# Patient Record
Sex: Female | Born: 1940 | Race: White | Hispanic: No | Marital: Married | Smoking: Never smoker
Health system: Southern US, Community
[De-identification: ages and names within clinical notes are randomized; demographics above are authoritative.]

## PROBLEM LIST (undated history)

## (undated) DIAGNOSIS — E119 Type 2 diabetes mellitus without complications: Secondary | ICD-10-CM

## (undated) DIAGNOSIS — I519 Heart disease, unspecified: Secondary | ICD-10-CM

## (undated) DIAGNOSIS — M1990 Unspecified osteoarthritis, unspecified site: Secondary | ICD-10-CM

## (undated) DIAGNOSIS — M810 Age-related osteoporosis without current pathological fracture: Secondary | ICD-10-CM

## (undated) HISTORY — DX: Age-related osteoporosis without current pathological fracture: M81.0

## (undated) HISTORY — PX: SPINAL FUSION: SHX223

## (undated) HISTORY — DX: Type 2 diabetes mellitus without complications: E11.9

## (undated) HISTORY — DX: Heart disease, unspecified: I51.9

## (undated) HISTORY — DX: Unspecified osteoarthritis, unspecified site: M19.90

---

## 2006-06-27 DIAGNOSIS — F32A Depression, unspecified: Secondary | ICD-10-CM | POA: Insufficient documentation

## 2006-09-27 ENCOUNTER — Ambulatory Visit: Payer: Self-pay | Admitting: Cardiology

## 2006-10-06 DIAGNOSIS — M797 Fibromyalgia: Secondary | ICD-10-CM | POA: Insufficient documentation

## 2007-01-17 ENCOUNTER — Inpatient Hospital Stay (HOSPITAL_COMMUNITY): Admission: RE | Admit: 2007-01-17 | Discharge: 2007-01-22 | Payer: Self-pay | Admitting: Neurosurgery

## 2007-07-09 ENCOUNTER — Inpatient Hospital Stay (HOSPITAL_COMMUNITY): Admission: RE | Admit: 2007-07-09 | Discharge: 2007-07-12 | Payer: Self-pay | Admitting: Orthopedic Surgery

## 2010-03-31 ENCOUNTER — Other Ambulatory Visit: Payer: Self-pay | Admitting: General Surgery

## 2010-03-31 DIAGNOSIS — N6320 Unspecified lump in the left breast, unspecified quadrant: Secondary | ICD-10-CM

## 2010-04-08 ENCOUNTER — Ambulatory Visit
Admission: RE | Admit: 2010-04-08 | Discharge: 2010-04-08 | Disposition: A | Discharge status: Home / Self Care | Payer: PRIVATE HEALTH INSURANCE | Source: Ambulatory Visit | Attending: General Surgery | Admitting: General Surgery

## 2010-04-08 ENCOUNTER — Other Ambulatory Visit: Payer: Self-pay | Admitting: General Surgery

## 2010-04-08 DIAGNOSIS — N6320 Unspecified lump in the left breast, unspecified quadrant: Secondary | ICD-10-CM

## 2010-04-08 DIAGNOSIS — N63 Unspecified lump in unspecified breast: Secondary | ICD-10-CM

## 2010-06-21 DIAGNOSIS — D099 Carcinoma in situ, unspecified: Secondary | ICD-10-CM

## 2010-06-21 DIAGNOSIS — D229 Melanocytic nevi, unspecified: Secondary | ICD-10-CM

## 2010-06-21 HISTORY — DX: Melanocytic nevi, unspecified: D22.9

## 2010-06-21 HISTORY — DX: Carcinoma in situ, unspecified: D09.9

## 2010-07-20 NOTE — H&P (Signed)
NAME:  Zoe Fleming, Zoe Fleming                 ACCOUNT NO.:  [id number]   MEDICAL RECORD NO.:  [id number]          PATIENT TYPE:  INP   LOCATION:  NA                           FACILITY:  [facility]   PHYSICIAN:  Ollen Gross, M.D.    DATE OF BIRTH:  Apr 11, 1940   DATE OF ADMISSION:  07/09/2007  DATE OF DISCHARGE:                              HISTORY & PHYSICAL   CHIEF COMPLAINT:  Painful left knee.   PRESENT ILLNESS:  The patient is a 70 year old female with history of  left knee pain with difficulty with range of motion and pain with  ambulation.  Patient has failed conservative treatment.  The patient has  elected proceed with a left total knee arthroplasty by Ollen Gross,  M.D.  The patient recently has gone through a back fusion with Hewitt Shorts, M.D..  She also has some arthritic changes in her right knee.   PAST MEDICAL HISTORY:  Includes:  1. Hypertension.  2. Recent spinal fusion at L3-4, 4-5/.   CURRENT MEDICATIONS:  Include:  1. Percocet 1 or 2 tablets every 4-6 hours p.r.n.  2. Lisinopril/hydrochlorothiazide 10/12.5 mg once a day.  3. Fortical nasal spray once a day.  4. Fish oil.  5. Calcium 1200 mg a day.  6. Coenzyme Q.  7. Vitamin D3.   ALLERGIES:  NO KNOWN DRUG ALLERGIES, BUT THE PATIENT HAS NAUSEA WITH A  SULFA.  THE PATIENT HAD SOME BLOOD PRESSURE ISSUES WITH MORPHINE WITH  PREVIOUS SURGICAL PROCEDURES.   PAST SURGICAL HISTORY:  Includes gallbladder removal in 1974, tubes tied  in 1979, a bladder tack in 1974, lumbar spinal fusion L3-4 and L4-5 in  01/2007 with just nausea with anesthesia last time requiring an NG tube.   FAMILY MEDICAL HISTORY:  Includes father is deceased from stroke at age  of 51.  Mother is deceased at 32 from pneumonia, one brother who has had  some heart attacks, one sister with cancer.   SOCIAL HISTORY:  The patient is married, lives with her husband in a one-  story house.  No history of smoking or alcohol use.   REVIEW OF SYSTEMS:   Negative for any neurologic issues.  PULMONARY:  She has had problems with pneumonia.  She was hospitalized  in the 1970s and bronchitis in 2007 without any recent issues.  CARDIOVASCULAR:  She does have some hypertension and some elevated  cholesterol.  She did have some inflammation around her heart in 2000,  but recent stress test and EKGs have been normal.  She has had no  recurrence or sequelae.  GI: She does have some issues with reflux.  Otherwise no other issues.  GU: She denies anything other a last kidney stone in 2002.  ENDOCRINE:  Negative.  HEMATOLOGIC:  Negative.   PHYSICAL EXAM:  VITALS:  Height is 5 feet, 4, weight is 180 pounds,  blood pressure is 142/84, pulse of 76, respirations 16, patient is  afebrile.  HEENT: Head was normocephalic.  Pupils equal, round and reactive.  Gross  hearing is intact.  NECK:  Supple.  Good range  of motion.  CHEST:  Lung sounds clear and equal bilaterally.  No wheezes, rales,  rhonchi.  HEART:  Regular rate and rhythm.  No murmurs, rubs or gallops.  ABDOMEN:  Soft, nontender.  Bowel sounds present.  EXTREMITIES:  Upper extremities had excellent range of motion without  any difficulty.  Lower Extremities:  Both hips had excellent range of motion.  The left  knee had 5 degrees short of full extension, flexion 125 degrees with no  instability.  No signs of infection.  Right knee had full extension,  flexion to 120 degrees.  Both calves were soft, nontender.  She had good  range of motion of both ankles.  PERIPHERAL VASCULAR:  Carotid pulses were 2+, no bruits.  Radial pulses  2+, dorsalis pedis pulses were 1+.  She had no lower extremity edema.  BREAST, RECTAL AND GU EXAMS:  Deferred at this time.   PRIMARY CARE PHYSICIAN:  Dr. Helane Gunther with [facility] in  [place], [place].   IMPRESSION:  1. End-stage osteoarthritis, left knee.  2. Hypertension.  3. Reflux disease.  4. Recent lumbar fusion L3-4, L4-5 by Hewitt Shorts,  M.D.   PLAN:  The patient will undergo all routine labs and tests prior to  having a left total knee arthroplasty by Ollen Gross, M.D. with [facility] on 07/09/2007.      Jamelle Rushing, P.A.      Ollen Gross, M.D.  Electronically Signed    RWK/MEDQ  D:  06/28/2007  T:  06/28/2007  Job:  [id number]   cc:   Ollen Gross, M.D.  Fax: [phone]

## 2010-07-20 NOTE — Discharge Summary (Signed)
NAME:  Zoe Fleming, Zoe Fleming                 ACCOUNT NO.:  [id number]   MEDICAL RECORD NO.:  [id number]          PATIENT TYPE:  INP   LOCATION:  3013                         FACILITY:  [facility]   PHYSICIAN:  Coletta Memos, M.D.     DATE OF BIRTH:  06-Nov-1940   DATE OF ADMISSION:  01/17/2007  DATE OF DISCHARGE:  01/22/2007                               DISCHARGE SUMMARY   ADMITTING DIAGNOSES:  Lumbar stenosis, lumbar spondylolisthesis,  acquired lumbar spondylosis, lumbar degenerative disk disease.   DISCHARGE DIAGNOSES:  Lumbar stenosis, lumbar spondylolisthesis,  acquired lumbar spondylosis, lumbar degenerative disk disease.   PROCEDURE:  L4-5, L5-S1 posterior lumbar interbody arthrodesis, AVS peak  interbody implants, L4-S1 posterolateral arthrodesis, and radius  posterior segmental instrumentation, L4-S1.   COMPLICATIONS:  None.   DISCHARGE STATUS:  Alive and well.   DISCHARGE DESTINATION:  Home.   At the time of discharge, the patient is voiding, ambulating, tolerating  a regular diet without difficulty.  Wound is clean, dry, no signs of  infection.  She was given Percocet and Vistaril at discharge for pain  control.   [name] was admitted, taken to the operating room by Dr. Shirlean Kelly for a lumbar stenosis L4-S1, and a spondylolisthesis.  She had  an uncomplicated procedure, and postoperatively he did quite well.  At  discharge, he is continuing to improve and well be sent home with home  health therapy consult.  She has a return appointment see Dr. Newell Coral,  for staple removal.           ______________________________  Coletta Memos, M.D.     KC/MEDQ  D:  01/22/2007  T:  01/22/2007  Job:  [id number]

## 2010-07-20 NOTE — Op Note (Signed)
NAME:  Zoe Fleming, SCHOOLS NO.:  [id number]   MEDICAL RECORD NO.:  [id number]          PATIENT TYPE:  INP   LOCATION:  3172                         FACILITY:  [facility]   PHYSICIAN:  Hewitt Shorts, M.D.DATE OF BIRTH:  [birth year]   DATE OF PROCEDURE:  01/17/2007  DATE OF DISCHARGE:                               OPERATIVE REPORT   PREOPERATIVE DIAGNOSES:  1. Lumbar stenosis.  2. Lumbar spondylolisthesis (dynamic).  3. Lumbar spondylosis.  4. Lumbar degenerative disc disease.   POSTOPERATIVE DIAGNOSES:  1. Lumbar stenosis.  2. Lumbar spondylolisthesis (dynamic).  3. Lumbar spondylosis.  4. Lumbar degenerative disc disease.   PROCEDURE:  1. L4, L5, and S1 lumbar laminectomy, facetectomy, foraminotomy with      microdissection for decompression, beyond that required for      performing a posterior lumbar interbody fusion and with      decompression of the exiting L4, L5, and S1 nerve roots.  2. L4-L5 and L5-S1 posterior lumbar interbody fusion with AVS peak      interbody implants and mosaic bone substitute with bone marrow      aspirate and L4 to S1 posterolateral arthrodesis with radius      posterior instrumentation, mosaic with bone marrow aspirate, and      Infuse.   ASSISTANTS:  Russell L. Webb Silversmith, RN  Stefani Dama, M.D.   ANESTHESIA:  General endotracheal.   INDICATIONS:  The patient is a 70 year old woman who presented with  neurogenic claudication.  MRI scan showed advanced degenerative changes  at the L4-L5 and L5-S1 levels with advanced facet arthropathy and a  grade I dynamic degenerative spondylolisthesis at L4 and L5 and a grade  I possibly dynamic spondylolisthesis at L5 and S1.  There was  significant canal and neuroforaminal stenosis, and a decision was made  to proceed with decompression and stabilization.   PROCEDURE IN DETAIL:  The patient was brought to the operating room and  was placed under general endotracheal anesthesia.  The  patient was  turned to prone position.  Lumbar region was prepped with Betadine scrub  and Betadine solution and draped in a sterile fashion.  The midline was  infiltrated with local anesthetic of epinephrine and a midline incision  was made over the lower lumbar region and carried down through the  subcutaneous tissue.  Bipolar electrocautery was used to maintain  hemostasis.  Dissection was carried down from the lumbar fascia, which  was incised bilaterally.  The paraspinal muscle was dissected from the  spinous process and lamina in a subperiosteal fashion.  X-ray was taken  at the L4, L5, and S1 spinous processes and lamina was identified and  then we proceeded with decompression and subsequently with the  stabilization.   A self-retaining retractor was placed and dissection was carried out  laterally exposing the facet joints, which were markedly hypertrophic as  well as the transverse process at L4 and L5 and the ala of S1  bilaterally.  Marked laxity of the L4-L5 facet joint was noted.  We  proceeded with an wide L4, L5, and S1  laminectomy and facetectomy and  foraminotomy using double-action rongeurs, the X-Max drill and Kerrison  punches.  There was marked stenosis at L4-L5, worse than L5-S1 and we  carefully with microdissection, decompressed the thecal sac, which was  severely compressed.  We then identified the exiting L4, L5, and S1  nerve roots bilaterally and continued the dissection laterally into the  neuroforamen and decompressed the nerve roots as they exited  bilaterally.  We then further explored the epidural space identifying  the annulus of the L4-L5 and L5-S1 discs bilaterally and overlying  epidural veins were coagulated and divided.  The listhesis at each level  was noted and we proceeded with the bilateral discectomy at each level  incising the annulus with a variety of microcurettes and pituitary  rongeurs.  Once a thorough discectomy was performed at each  level, we  began to prepare the endplates using the variety of paddle curettes to  remove the cartilaginous endplate surfaces down to a good bony surface.  We then measured the height of the disc spaces and selected PEEK  interbody implants at L4-L5, 30 mm in height, 25 mm in depth, 8 degrees  of lordosis, and 11 mm of width.  At L5-S1, we selected 10 mm of height,  25 mm of depth, 4 degrees of lordosis, and 11 mm of width.  We then  probed the right L5 pedicle and aspirated bone marrow aspirated, which  was injected over 215 mL strips of mosaic, each of which was saturated  with bone marrow aspirate.  Then, we packed the interbody implants with  the mosaic with bone marrow aspirate and then carefully retracting the  thecal sac and nerve roots, first on the right side placing the implants  on the right at L4-L5 and then packing additional mosaic in the midline  of the disc space and then placing the second implant on the left side  at L4-L5.  We then packed additional mosaic lateral to the implants  bilaterally at L4-L5.   At L5-S1, we similarly did interbody fusion placing the implant first on  the right side and then packing additional mosaic in the midline and  then placing the second implant on the left side with additional mosaic  being tacked lateral to the implants.  Once the interbody fusion was  completed, we proceeded with posterolateral arthrodesis.   The C-arm fluoroscope was draped and brought into the field to provide  guidance in placing posterior instrumentation and we identified pedicle  entry sites for L4, L5, and S1 bilaterally.  Each of the pedicles was  probed, tapped with a 5.25-mm tap, and we placed 5.75 x 45 mm screws at  L4, 5.75 x 40-mm screws at L5, and 5.75 x 35-mm screws at S1.  Each of  the screw holes was drilled and examined with a ball probe, no cut outs  were found and tapped with a 5.25-mm tap.  I again examined with a ball  probe, no cut outs were  found, good threading was noted and then we  placed each of the 6 screws.  We then selected two 50-mm pre-lordosed  rods.  They were positioned in the screw heads, and secured with locking  caps, which were subsequently tightened again with the counter torque.  We then selected a medium sized cross-link, which was placed between the  L4 and L5 screws, secured to the rods, and then tightened the middle.  We then went ahead and proceeded with the posterolateral listhesis  tapping bone graft laterally and the lateral gutters over the transverse  processes and ala, which were decorticated.  We placed Infuse medium-  sized inner lateral gutters and overlaid that with the mosaic with bone  marrow aspirates, which was compressed against the bony surfaces.  The  wounds had been irrigated numerous times at the procedure with both  saline solution and Bacitracin solution.  Good hemostasis was  established and then we proceeded with closure.  The paraspinal muscle  was reapproximated with interrupted undyed 1 Vicryl suture.  The deep  fascia was closed with interrupted undyed 1 Vicryl suture.  The Scarpa  fascia was closed with interrupted undyed 1 Vicryl suture, and the  subcutaneous and subcuticular were closed with interrupted inverted 2-0  Vicryl sutures and the skin was reapproximated with surgical staples.  The wound was dressed with Adaptic and sterile gauze.  The patient was  tolerated well.  Estimated blood loss was 500 mL.  We were able to give  the patient back 230 mL of blood.  Sponge and needle counts were  correct.  Following the surgery, the patient was turned back to supine  position to be reverse from the anesthetic, extubated, and transferred  to the recovery room for further care.      Hewitt Shorts, M.D.  Electronically Signed     RWN/MEDQ  D:  01/17/2007  T:  01/18/2007  Job:  [id number]

## 2010-07-20 NOTE — Op Note (Signed)
NAME:  Zoe Fleming, SELDON NO.:  [id number]   MEDICAL RECORD NO.:  [id number]          PATIENT TYPE:  INP   LOCATION:  0004                         [facility]   PHYSICIAN:  Ollen Gross, M.D.    DATE OF BIRTH:  1940/04/11   DATE OF PROCEDURE:  07/09/2007  DATE OF DISCHARGE:                               OPERATIVE REPORT   PREOPERATIVE DIAGNOSIS:  Osteoarthritis, left knee.   POSTOPERATIVE DIAGNOSIS:  Osteoarthritis, left knee.   OPERATIONS:  Left total knee arthroplasty.   SURGEON:  Ollen Gross, M.D.   ASSISTANT:  Alexzandrew L. Perkins, PA-C.   ANESTHESIA:  General with postop marcaine pain pump.   ESTIMATED BLOOD LOSS:  Minimal.   DRAINS:  None.   TOURNIQUET TIME:  28 minutes at 300 mmHg.   COMPLICATIONS:  None.   CONDITION:  Stable to recovery.   BRIEF CLINICAL NOTE:  Zoe Fleming is a 70 year old female with end-stage  arthritis of the left knee with progressively worsening pain and  dysfunction.  She has failed nonoperative management and presents now  for total knee arthroplasty.   DESCRIPTION OF PROCEDURE:  After successful administration of general  anesthetic, a tourniquet was placed high on her left thigh and left  lower extremity prepped and draped in the usual sterile fashion.  The  extremity was wrapped in Esmarch, knee flexed, tourniquet inflated to  300 mmHg.  Midline incision is made with a 10 blade through the  subcutaneous tissue to the level of the extensor mechanism.  Fresh blade  was used to made a medial parapatellar arthrotomy.  Soft tissue over the  proximal and medial tibia is subperiosteally elevated to the joint line  with the knife and into the semimembranosus bursa with a Cobb elevator.  Soft tissue laterally is elevated with attention being paid to avoid the  patellar tendon on the tibial tubercle.  The patella was subluxed  laterally, knee flexed 90 degrees, ACL and PCL removed.  A drill was  used to create a  starting hole in the distal femur and the canal is  thoroughly irrigated.  The 5 degree left valgus alignment guide is  placed and referencing off the posterior condyle as rotation is marked  and the block pinned to removed 11 mm off the distal femur.  I took 11  because of the preoperative flexion contracture.  Distal femoral  resection is made with an oscillating saw.  Sizing block is placed.  Size 2.5 is most appropriate.  The size 2.5 cutting block is placed and  then the anterior, posterior and chamfer cuts are made.   The tibia is subluxed forward and the menisci removed.  Extramedullary  tibial alignment guide is placed referencing proximally at the medial  aspect of the tibial tubercle and distally along the second metatarsal  axis and tibial crest.  The block is pinned to remove 10 mm off the non  deficient lateral side.  I had to go down 2 more millimeters to get to  the base of the defect.  Tibial resection is made with an oscillating  size.  The size 2.5 is the most appropriate tibial component and the  proximal tibia is prepared with the modular drill and keel punch to size  2.5.  Femoral preparation is completed with the anterior condylar cut.   The size 2.5 mobile bearing tibial trial, 2.5 posterior stabilized  femoral trial and a 12.5 mm posterior stabilized rotating platform  insert trial are placed.  With the 12.5, full extension is achieved with  excellent varus and valgus balance throughout full range of motion.  The  patella is then everted and thickness measured to be 20 mm.  Free hand  resection is taken to 11 mm, 35 template is placed, lug holes are  drilled, trial patella is placed and it tracks normally.  Osteophytes  are removed off the posterior femur with the trial in place.  All trials  are removed and the cut bone surfaces are prepared with pulsatile  lavage.  Cement is mixed and once ready for implantation the size 2.5  mobile bearing tibial tray, size 2.5  posterior stabilized femur and 35  patella was cemented into place and patella is held with a clamp.  Trial  12.5 insert is placed, knee held in full extension and all extruded  cement removed.  Once the cement is fully hardened, then the wound is  copiously irrigated with saline solution and the FloSeal injected in the  posterior capsule.  The permanent 12.5 mm posterior stabilized rotating  platform insert is then placed in the tibial tray.  FloSeal is then  injected in the medial and lateral gutters and suprapatellar area.  The  moist sponge is placed and tourniquet released with a total time of 28  minutes.  Sponge is held to 2 minutes then removed.  Minimal bleeding is  encountered.  The bleeding that is encountered is stopped with  electrocautery.  The wound is copiously irrigated with saline solution  and the extensor mechanism closed with the running #2 Quill suture.  The  subcu is closed with the 0 Quill suture and subcuticular running 4-0  Monocryl.  Flexion against gravity was 140 degrees.  The incision is  cleaned and dried and the catheter for the Marcaine pain pump is placed  and the pump is initiated.  Steri-Strips and a bulky sterile dressing  are applied.  She is awakened and transported to recovery in stable  condition.      Ollen Gross, M.D.  Electronically Signed     FA/MEDQ  D:  07/09/2007  T:  07/09/2007  Job:  [id number]   cc:   Alexzandrew L. Julien Girt, P.A.C.

## 2010-07-20 NOTE — H&P (Signed)
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  She has had a history of low back pain for at least 14  years, maybe longer, worse though over the past year.  She does not  recall any particular setting cause that aggravated the pain, complains  of pain in the low back extending to the buttocks, posterior thigh, and  legs bilaterally with numbness and tingling of her lower extremities.  It is relieved by laying down and begins immediately upon standing and  walking.  She is limiting walking to less than a block before the pain  becomes incapacitating.  She is being treated with epidural steroid  injections, Voltaren patches, Neurontin, Limbrel, and Ultracet.  She has  undergone chiropractic treatment including decompressive therapy without  any relief.  She underwent acupuncture without any relief.   The patient was evaluated with MRI scan.  It revealed degenerative  spondylolisthesis at L4-L5 and L5-S1.  X-rays with flexion-extension  views showed that these are dynamic listhesis.  There is a broad-based  disk protrusions at L4-L5, worse than L5-S1 with resulting  multifactorial multilevel lumbar stenosis at L4-L5 worse than L5-S1.   The patient is admitted now for decompression and arthrodesis.   PAST MEDICAL HISTORY:  Notable for history of hypertension for over 25  years as well as a history of gastroesophageal reflux disease, restless  leg syndrome,  and osteoarthritis.  No history though of myocardial  infarction, cancer, stroke, diabetes, or lung disease.   PREVIOUS SURGERIES:  Includes cholecystectomy, bladder tack, and tubal  ligation.   ALLERGIES:  She has no allergies to medications, but most narcotics  cause nausea and vomiting.   MEDICATIONS:  1. Zestoretic 20/12.5 mg daily.  2. Multivitamin.  3. Citracal with vitamin D t.i.d.  4. We started her on Miacalcin nasal spray prior to surgery.   FAMILY HISTORY:  Parents have passed on, mother at age 21 from  pneumonia, and father at age 48 with stroke.  Family history is relevant  for myocardial infarction, diabetes, and cancer.   SOCIAL HISTORY:  The patient is retired.  She is married.  She does not  smoke, drink, or have history of substance abuse.   REVIEW OF SYSTEMS:  Notable for what is described in history of present  illness and past medical history.  A 14-point review of systems is,  otherwise, unremarkable.   PHYSICAL EXAMINATION:  GENERAL:  The patient is a well-developed, well-  nourished white female, in no distress.  VITAL SIGNS:  Temperature 97.9, pulse 75, blood pressure 150/86,  respiratory  rate 18, height 5 feet 4 inches, and weight 195 pounds.  LUNGS:  Clear to auscultation.  She has symmetrical respirations.  HEART:  Regular rate and rhythm.  Normal S1 and S2.  There is no murmur.  ABDOMEN:  Soft and nondistended.  Bowel sounds are present.  EXTREMITIES:  Extremities examination shows no clubbing, cyanosis, or  edema.  MUSCULOSKELETAL:  No tenderness to palpation over the lumbar spinous  process or paralumbar musculature.  Straight leg raising is negative  bilaterally.  She is able to flex 90 degrees and has no pain with that,  but she does have pain with extension to about 5 degrees.  NEUROLOGIC:  Examination shows 5/5 strength in the lower extremities  including the iliopsoas, quadriceps, dorsiflexors, hallucis longus and  plantar flexor  bilaterally.  Sensation is intact to pinprick in the  upper and lower extremities.  Reflexes are absent in biceps,  brachioradialis, triceps, 1 in the quadriceps, and minimally at the  gastrocnemius, symmetrical bilaterally.  Toes are downgoing bilaterally.  Pain with stance.  She supports herself with a single-point cane due to  discomfort.   IMPRESSION:  The patient with worsening neurogenic claudications  secondary to marked severe multifactorial lumbar stenosis at L4-L5 and  moderate multifactorial lumbar stenosis at L5-S1.  Motor and sensory  function are intact.  Unfortunately, extensive nonsurgical management is  not giving her relief of her pain and discomfort.   PLAN:  The patient is admitted for lumbar decompression and arthrodesis,  requiring an L4 to S1 decompressive lumbar laminectomy bilaterally, L4-  L5 and L5-S1 posterior lumbar interbody fusion with interbody. Implant  and bone graft, and bilateral L4 to S1 posterolateral arthrodesis with  posterior instrumentation and bone graft.   We have discussed the nature of the surgical procedure and alternatives  to surgery, typical length of surgery, hospital stay overall,  limitations postoperatively, postoperative mobilization in a lumbar  brace, and risk of surgery include risk of infection, bleeding, possible  need for transfusion, risk of nerve dysfunction, pain, weakness,  numbness or paresthesia, risk of dural tear and CSF leak, possible need  for further surgery, the risk of failure of the arthrodesis and possibly  need for further surgery and anesthetic risks, myocardial infarction,  stroke, pneumonia and death. After discussing all this, she would like  to go ahead with surgery and is now admitted for such.      Hewitt Shorts, M.D.  Electronically Signed     RWN/MEDQ  D:  01/18/2007  T:  01/18/2007  Job:  [id number]

## 2010-07-23 NOTE — Discharge Summary (Signed)
NAME:  Zoe Fleming, Zoe Fleming                 ACCOUNT NO.:  [id number]   MEDICAL RECORD NO.:  [id number]          PATIENT TYPE:  INP   LOCATION:  1606                         [facility]ACILITY:  [facility]   PHYSICIAN:  Ollen Gross, M.D.    DATE OF BIRTH:  1940/09/23   DATE OF ADMISSION:  07/09/2007  DATE OF DISCHARGE:  07/12/2007                               DISCHARGE SUMMARY   ADMITTING DIAGNOSES:  1. End-stage arthritis, left knee.  2. Hypertension.  3. Reflux disease.  4. Status post lumbar fusion L3, L4-5, Dr. Newell Coral.   DISCHARGE DIAGNOSES:  1. Osteoarthritis, left knee, status post left total knee replacement      arthroplasty.  2. Mild postop blood loss anemia.  Did not require transfusion.  3. Hypertension.  4. Reflux disease.  5. Status post lumbar fusion L3, L4-5, Dr. Newell Coral.   PROCEDURE:  Jul 09, 2007:  Left total knee.  Surgeon Dr. Lequita Halt.  Assistant Avel Peace PA-C.  Anesthesia general.   CONSULTS:  None.   BRIEF HISTORY:  [name] is a 70 year old female with end-stage  arthritis of the left knee, progressive worsening pain, dysfunction,  admitted for operative management, now presents for total knee  arthroplasty.   LABORATORY DATA:  Preop CBC showed a hemoglobin of 12.4, hematocrit  36.1, white cell count 5.8, platelets 355,000.  Postop hemoglobin 10.2,  drifted down to 9.3.  Last noted H&H 9.2 and 26.8.  PT/PTT preop 13 and  27 respectively.  INR 1.  Serial protimes followed.  Last known PT/INR  18.7, 1.5.  Chem panel on admission all within normal limits.  Serial B  mets were followed.  Electrolytes remained within normal limits.  Preop  UA negative.  Blood group type A positive.  EKG dated June 08, 2007,  normal EKG unconfirmed.   HOSPITAL COURSE:  The patient admitted to [facility].  Tolerated procedure well.  Later transferred to recovery room and  orthopedic floor and started on PCA and p.o. analgesics for pain control  following surgery.  Given 24  hours postop IV antibiotics.  Started on  Coumadin for DVT prophylaxis.  Seen in rounds on day one.  Started up  getting up out of bed.  Started back on home meds.  We did hold her ACE  inhibitor though.  Blood pressure was stable without it.  Started  weightbearing as tolerated.  Had decent urinary output.  Walked about  100 feet, doing extremely well with therapy that afternoon.  By day two,  doing a lot better.  A little bit of slight itching.  Discontinued the  PCA.  Encouraged p.o. meds at that point.  Hemoglobin was 9.3.  Dressing  changed.  Incision looked good.  Continued to ambulate well, about 100  feet.  At that point, the patient had progressed well, tolerating meds,  was discharged home the following day of Jul 12, 2007.   DISCHARGE/PLAN:  1. The patient discharged home Jul 12, 2007.  2. Discharge diagnoses, please see above.   DISCHARGE MEDS:  1. Coumadin.  2. Nu-Iron.  3. Percocet.  4.  Robaxin.   DIET:  Low-sodium diet.   ACTIVITY:  Weightbearing as tolerated, total knee protocol.   DISPOSITION:  Home.   CONDITION UPON DISCHARGE:  Improving.      Alexzandrew L. Perkins, P.A.C.      Ollen Gross, M.D.  Electronically Signed    ALP/MEDQ  D:  08/22/2007  T:  08/22/2007  Job:  [id number]   cc:   Ollen Gross, M.D.  Fax: [phone]   Ander Slade, M.D., [facility] [place], [place]

## 2010-11-30 LAB — URINALYSIS, ROUTINE W REFLEX MICROSCOPIC
Bilirubin Urine: NEGATIVE
Glucose, UA: NEGATIVE
Hgb urine dipstick: NEGATIVE
Ketones, ur: NEGATIVE
Protein, ur: NEGATIVE
Specific Gravity, Urine: 1.02
Urobilinogen, UA: 0.2
pH: 6

## 2010-11-30 LAB — COMPREHENSIVE METABOLIC PANEL
ALT: 15
AST: 15
Albumin: 3.8
Alkaline Phosphatase: 73
BUN: 14
CO2: 27
Chloride: 102
Creatinine, Ser: 0.8
Glucose, Bld: 123 — ABNORMAL HIGH
Sodium: 136
Total Bilirubin: 0.6
Total Protein: 6.5

## 2010-11-30 LAB — TYPE AND SCREEN

## 2010-11-30 LAB — CBC: RDW: 13.8

## 2010-11-30 LAB — APTT: aPTT: 27

## 2010-12-14 LAB — TYPE AND SCREEN
ABO/RH(D): A POS
Antibody Screen: NEGATIVE

## 2010-12-14 LAB — CBC
HCT: 36.8
Hemoglobin: 12.6
MCHC: 34.4
MCV: 87.5
Platelets: 400
RBC: 4.2
RDW: 14.1 — ABNORMAL HIGH
WBC: 6.7

## 2010-12-14 LAB — BASIC METABOLIC PANEL
BUN: 12
BUN: 7
CO2: 28
CO2: 30
Calcium: 8.4
Calcium: 9.6
Chloride: 102
Chloride: 107
Creatinine, Ser: 0.73
Creatinine, Ser: 0.77
GFR calc Af Amer: >60
GFR calc Af Amer: >60
GFR calc non Af Amer: >60
GFR calc non Af Amer: >60
Glucose, Bld: 139 — ABNORMAL HIGH
Glucose, Bld: 94
Potassium: 3.6
Potassium: 4.4
Sodium: 137
Sodium: 138

## 2010-12-14 LAB — ABO/RH: ABO/RH(D): A POS

## 2011-01-14 ENCOUNTER — Other Ambulatory Visit: Payer: Self-pay | Admitting: Internal Medicine

## 2011-01-14 DIAGNOSIS — Z1231 Encounter for screening mammogram for malignant neoplasm of breast: Secondary | ICD-10-CM

## 2011-01-31 ENCOUNTER — Other Ambulatory Visit: Payer: Self-pay | Admitting: Internal Medicine

## 2011-02-24 DIAGNOSIS — I209 Angina pectoris, unspecified: Secondary | ICD-10-CM | POA: Insufficient documentation

## 2011-03-17 ENCOUNTER — Ambulatory Visit: Payer: PRIVATE HEALTH INSURANCE

## 2011-03-31 DIAGNOSIS — K8689 Other specified diseases of pancreas: Secondary | ICD-10-CM | POA: Insufficient documentation

## 2011-08-30 DIAGNOSIS — I428 Other cardiomyopathies: Secondary | ICD-10-CM | POA: Insufficient documentation

## 2012-10-08 DIAGNOSIS — K219 Gastro-esophageal reflux disease without esophagitis: Secondary | ICD-10-CM | POA: Insufficient documentation

## 2013-02-11 ENCOUNTER — Other Ambulatory Visit: Payer: Self-pay | Admitting: Neurosurgery

## 2013-02-11 DIAGNOSIS — M5412 Radiculopathy, cervical region: Secondary | ICD-10-CM

## 2013-02-11 DIAGNOSIS — M47816 Spondylosis without myelopathy or radiculopathy, lumbar region: Secondary | ICD-10-CM

## 2013-02-20 ENCOUNTER — Ambulatory Visit
Admission: RE | Admit: 2013-02-20 | Discharge: 2013-02-20 | Disposition: A | Discharge status: Home / Self Care | Payer: Medicare Other | Source: Ambulatory Visit | Attending: Neurosurgery | Admitting: Neurosurgery

## 2013-02-20 ENCOUNTER — Ambulatory Visit
Admission: RE | Admit: 2013-02-20 | Discharge: 2013-02-20 | Disposition: A | Discharge status: Home / Self Care | Payer: PRIVATE HEALTH INSURANCE | Source: Ambulatory Visit | Attending: Neurosurgery | Admitting: Neurosurgery

## 2013-02-20 DIAGNOSIS — M47816 Spondylosis without myelopathy or radiculopathy, lumbar region: Secondary | ICD-10-CM

## 2013-02-20 DIAGNOSIS — M5412 Radiculopathy, cervical region: Secondary | ICD-10-CM

## 2013-02-20 MED ORDER — GADOBENATE DIMEGLUMINE 529 MG/ML IV SOLN
15.0000 mL | Freq: Once | INTRAVENOUS | Status: AC | PRN
  Administered 2013-02-20: 15 mL via INTRAVENOUS

## 2013-12-25 DIAGNOSIS — Z8679 Personal history of other diseases of the circulatory system: Secondary | ICD-10-CM | POA: Insufficient documentation

## 2013-12-25 DIAGNOSIS — IMO0001 Reserved for inherently not codable concepts without codable children: Secondary | ICD-10-CM | POA: Insufficient documentation

## 2014-06-13 DIAGNOSIS — E119 Type 2 diabetes mellitus without complications: Secondary | ICD-10-CM | POA: Insufficient documentation

## 2014-10-10 DIAGNOSIS — I10 Essential (primary) hypertension: Secondary | ICD-10-CM | POA: Insufficient documentation

## 2014-12-31 DIAGNOSIS — E785 Hyperlipidemia, unspecified: Secondary | ICD-10-CM | POA: Insufficient documentation

## 2015-07-20 DIAGNOSIS — I712 Thoracic aortic aneurysm, without rupture, unspecified: Secondary | ICD-10-CM | POA: Insufficient documentation

## 2015-07-20 DIAGNOSIS — M5136 Other intervertebral disc degeneration, lumbar region: Secondary | ICD-10-CM | POA: Insufficient documentation

## 2015-08-10 DIAGNOSIS — K760 Fatty (change of) liver, not elsewhere classified: Secondary | ICD-10-CM | POA: Insufficient documentation

## 2015-08-10 DIAGNOSIS — Z4659 Encounter for fitting and adjustment of other gastrointestinal appliance and device: Secondary | ICD-10-CM | POA: Insufficient documentation

## 2015-08-12 DIAGNOSIS — K861 Other chronic pancreatitis: Secondary | ICD-10-CM | POA: Insufficient documentation

## 2015-08-18 DIAGNOSIS — Z8679 Personal history of other diseases of the circulatory system: Secondary | ICD-10-CM | POA: Insufficient documentation

## 2015-11-19 DIAGNOSIS — K439 Ventral hernia without obstruction or gangrene: Secondary | ICD-10-CM | POA: Insufficient documentation

## 2016-02-10 DIAGNOSIS — E119 Type 2 diabetes mellitus without complications: Secondary | ICD-10-CM | POA: Insufficient documentation

## 2016-02-10 DIAGNOSIS — H04123 Dry eye syndrome of bilateral lacrimal glands: Secondary | ICD-10-CM | POA: Insufficient documentation

## 2016-02-10 DIAGNOSIS — H2510 Age-related nuclear cataract, unspecified eye: Secondary | ICD-10-CM | POA: Insufficient documentation

## 2016-06-30 DIAGNOSIS — Z8679 Personal history of other diseases of the circulatory system: Secondary | ICD-10-CM | POA: Insufficient documentation

## 2016-07-04 DIAGNOSIS — I714 Abdominal aortic aneurysm, without rupture, unspecified: Secondary | ICD-10-CM | POA: Insufficient documentation

## 2016-07-04 DIAGNOSIS — I10 Essential (primary) hypertension: Secondary | ICD-10-CM | POA: Insufficient documentation

## 2016-07-04 DIAGNOSIS — M1990 Unspecified osteoarthritis, unspecified site: Secondary | ICD-10-CM | POA: Insufficient documentation

## 2016-07-04 DIAGNOSIS — R42 Dizziness and giddiness: Secondary | ICD-10-CM | POA: Insufficient documentation

## 2016-07-04 DIAGNOSIS — K219 Gastro-esophageal reflux disease without esophagitis: Secondary | ICD-10-CM | POA: Insufficient documentation

## 2016-07-04 DIAGNOSIS — K861 Other chronic pancreatitis: Secondary | ICD-10-CM | POA: Insufficient documentation

## 2016-07-04 DIAGNOSIS — J986 Disorders of diaphragm: Secondary | ICD-10-CM | POA: Insufficient documentation

## 2016-12-06 ENCOUNTER — Ambulatory Visit (INDEPENDENT_AMBULATORY_CARE_PROVIDER_SITE_OTHER): Payer: Medicare Other | Admitting: Physical Medicine and Rehabilitation

## 2016-12-06 ENCOUNTER — Encounter (INDEPENDENT_AMBULATORY_CARE_PROVIDER_SITE_OTHER): Payer: Self-pay | Admitting: Physical Medicine and Rehabilitation

## 2016-12-06 VITALS — BP 157/91 | HR 64

## 2016-12-06 DIAGNOSIS — G894 Chronic pain syndrome: Secondary | ICD-10-CM | POA: Diagnosis not present

## 2016-12-06 DIAGNOSIS — M961 Postlaminectomy syndrome, not elsewhere classified: Secondary | ICD-10-CM | POA: Diagnosis not present

## 2016-12-06 DIAGNOSIS — G8929 Other chronic pain: Secondary | ICD-10-CM

## 2016-12-06 DIAGNOSIS — M546 Pain in thoracic spine: Secondary | ICD-10-CM | POA: Diagnosis not present

## 2016-12-06 DIAGNOSIS — M545 Low back pain: Secondary | ICD-10-CM | POA: Diagnosis not present

## 2016-12-06 NOTE — Progress Notes (Deleted)
Lower back pain. Worse on the right. History of back surgery. Feels better lying down. Worse with sitting, walking. Riding in the car is very painful. Has some pain in right leg down to knee.

## 2016-12-21 ENCOUNTER — Ambulatory Visit (INDEPENDENT_AMBULATORY_CARE_PROVIDER_SITE_OTHER): Payer: Medicare Other | Admitting: Physical Medicine and Rehabilitation

## 2016-12-21 ENCOUNTER — Encounter (INDEPENDENT_AMBULATORY_CARE_PROVIDER_SITE_OTHER): Payer: Self-pay | Admitting: Physical Medicine and Rehabilitation

## 2016-12-21 ENCOUNTER — Ambulatory Visit (INDEPENDENT_AMBULATORY_CARE_PROVIDER_SITE_OTHER): Payer: Medicare Other

## 2016-12-21 VITALS — BP 138/80 | HR 66 | Temp 98.3°F

## 2016-12-21 DIAGNOSIS — M47816 Spondylosis without myelopathy or radiculopathy, lumbar region: Secondary | ICD-10-CM

## 2016-12-21 DIAGNOSIS — N201 Calculus of ureter: Secondary | ICD-10-CM | POA: Insufficient documentation

## 2016-12-21 DIAGNOSIS — G8929 Other chronic pain: Secondary | ICD-10-CM | POA: Insufficient documentation

## 2016-12-21 DIAGNOSIS — G894 Chronic pain syndrome: Secondary | ICD-10-CM | POA: Insufficient documentation

## 2016-12-21 DIAGNOSIS — M961 Postlaminectomy syndrome, not elsewhere classified: Secondary | ICD-10-CM | POA: Insufficient documentation

## 2016-12-21 DIAGNOSIS — M545 Low back pain, unspecified: Secondary | ICD-10-CM | POA: Insufficient documentation

## 2016-12-21 DIAGNOSIS — M546 Pain in thoracic spine: Principal | ICD-10-CM

## 2016-12-21 DIAGNOSIS — E669 Obesity, unspecified: Secondary | ICD-10-CM | POA: Insufficient documentation

## 2016-12-21 MED ORDER — LIDOCAINE HCL (PF) 1 % IJ SOLN
2.0000 mL | Freq: Once | INTRAMUSCULAR | Status: AC
  Administered 2016-12-21: 2 mL

## 2016-12-21 MED ORDER — METHYLPREDNISOLONE ACETATE 80 MG/ML IJ SUSP
80.0000 mg | Freq: Once | INTRAMUSCULAR | Status: AC
  Administered 2016-12-21: 80 mg

## 2016-12-21 NOTE — Progress Notes (Signed)
Zoe Fleming - 76 y.o. female MRN [id number]  Date of birth: 01/23/1941  Office Visit Note: Visit Date: 12/06/2016 PCP: Patient, No Pcp Per Referred by: No ref. provider found  Subjective: Chief Complaint  Patient presents with  . Lower Back - Pain  . Middle Back - Pain  . Spine - Pain   HPI: [name] is a 76 year old female who comes in today as a self-referral for mid back and low back pain chronic worsening and severe with significant history of prior lumbar fusion from L4 to the sacrum by Dr. Sherwood Gambler. This surgery was performed in 2008 I believe. She was trying to get her records sent to [place] before her visit but we did not receive those. I do have her records from [facility] in [place] where she obtains most of her care. These notes are on the Epic system. Her complaints today are multiple. She is having essentially 2 problems. One is mid back pain and really at about the bra line area. This pain is more right-sided and is not worse with breathing. She does have some difficulty breathing with a history of elevated diaphragm. She's had some long-term medical issues which are well documented. She was hospitalized this year with chronic pancreatitis and she was having this problem with elevated diaphragm. There are x-rays dated April 2018 reviewing the chest and he did see the thoracic spine as well. She has multilevel degenerative change. She has had no thoracic surgery or interventions. She has had some rib pain but no radiating pain or numbness. Symptoms are worse with really any activity such as sitting walking and riding in the car. She does feel some better lying down. She also is having chronic worsening low back pain.  Low back pain is been present for many many years. Again she is status post lumbar fusion from L4 to the sacrum by Dr. Sherwood Gambler. The last MRI we have is 2016. I do have the report but not the images. She does have adjacent level disease at L2-S3 and L3-4 with  moderate multifactorial stenosis and facet arthropathy. She reports pain worse is sitting and walking. She says riding in a car is very painful. She does get some referral pain in the right leg down to the knee. She's had no focal weakness but feels weak in general. She has complicating factors of heart failure and hypertension and diabetes as well as anxiety and depression and history of fibromyalgia. She is currently taking some oxycodone provided by her primary physicians.    Review of Systems  Constitutional: Positive for malaise/fatigue. Negative for chills, fever and weight loss.  HENT: Negative for hearing loss and sinus pain.   Eyes: Negative for blurred vision, double vision and photophobia.  Respiratory: Positive for shortness of breath. Negative for cough.   Cardiovascular: Negative for chest pain, palpitations and leg swelling.  Gastrointestinal: Negative for abdominal pain, nausea and vomiting.  Genitourinary: Negative for flank pain.  Musculoskeletal: Positive for back pain and joint pain. Negative for myalgias.  Skin: Negative for itching and rash.  Neurological: Positive for weakness. Negative for tremors and focal weakness.  Endo/Heme/Allergies: Negative.   Psychiatric/Behavioral: Negative for depression.  All other systems reviewed and are negative.  Otherwise per HPI.  Assessment & Plan: Visit Diagnoses:  1. Chronic right-sided thoracic back pain   2. Chronic right-sided low back pain without sciatica   3. Post laminectomy syndrome   4. Chronic pain syndrome     Plan: Findings:  Complicated  history of chronic worsening thoracic and low back pain. She is very tall individual I still wonder if most of her pain is related to the levels above the fusion which would be L2 S3 and L3-4. She is pretty adamant about the pain referral being in the thoracic spine and answer worse problem at least most recently. She is having low back pain as well worse with standing and ambulating  but also worse at rest. MRI from 2016 did not show any focal disc herniations but did show adjacent level disease above the fusion. Her case is again complicated medically and complicated by fibromyalgia and myofascial pain. I think from a standpoint of diagnostic and therapeutic intervention we can try thoracic medial branch blocks over the area of greatest pain. I would probably perform 3 level on the right is to see if she gets any relief diagnostically. If she does we can go down the road of radiofrequency ablation. In terms of her lumbar spine think it may be related to the stenosis above the fusion. There was moderate in 2016 I doubt it is worsened over the last 2 years but at some point to look at a repeat MRI and probably MRI of the thoracic spine depending on where her pain is at that point. She clearly is taking oxycodone a mild amount from her primary physicians. I told her we cannot take over opioid management at our facility especially since she is coming from [place]. This would be best be managed there. I would try to help her if we can diagnostically and then obviously therapeutically. Otherwise we talked about activity modification and continued to stay active. She doesn't have anything at least on the imaging that I have seen that would warrant her worrying about her spine I think everything is intact and she can continue to move and do things that she wishes with the caveat of anything this really bothering her she should avoid. She would probably do well with a regrouping with the physical therapist at some point she has had this in the past but not recently.Greater than 50% of this visit (total duration of visit 45 minutes) was spent in counseling and coordination of care discussing sequela of lumbar spinal fusion and low back pain and thoracic pain and the problems with interventions in the thoracic spine as well as historical findings of pain in the thoracic spine. We also talked about  fibromyalgia and myofascial pain.    Meds & Orders: No orders of the defined types were placed in this encounter.  No orders of the defined types were placed in this encounter.   Follow-up: Return for Right-sided thoracic medial branch blocks.   Procedures: No procedures performed  No notes on file   Clinical History: XR right Ribs, 4 View. 06/2016  CLINICAL HISTORYrib pain,  COMPARISON: [facility]- XR CHEST 2 VWS- 02/17/2014 04:35 PM EST   FINDINGS:  LUNGS: No focal airspace consolidation.There is persistent elevation of the right hemidiaphragm, this is unchanged compared to prior imaging.  PLEURAL SPACES: No pleural effusion. No pneumothorax.   BONES: No visible acute rib fracture.   LIMITED ABDOMEN: Pigtail catheter projects over the upper abdomen.Surgical clips are noted within the right upper quadrant of the abdomen.Lumbar spinal fusion hardware is partially visualized.  IMPRESSION:  No visible acute rib fracture. No pneumothorax.Stable elevation of the right hemidiaphragm -------- Lumbar MRI 05/21/2014  CLINICAL HISTORY: Right Sided numbness with out sciatica, numbness and tingling.   TECHNIQUE:An MRI examination of the lumbar  spine was performed utilizing multiplanar and multi sequential imaging with and without contrast. 8 cc of gadavist was administered.  COMPARISON STUDY:None  FINDINGS:   There has been a posterior fusion from L4-S1. There are interbody disc spacers present at L4-L5 and L5-S1. Chronic loss of height of T11.  At L5-S1 is been a right hemilaminectomy. There is no central spinal canal stenosis. The neural foramina are not stenotic. There is no abnormal enhancing granulation tissue at this disc space level. There is a 2.7 x 1.1 cm nonenhancing fluid collection posterior and lateral to the thecal sac which may represent a small seroma.  At L4-L5 there has been a wide laminectomy. There is no central spinal canal stenosis. The neural  foramina are not stenotic.  At L3-L4 there is a moderate concentric disc bulge. There is bilateral facet hypertrophy and ligamentum flavum thickening. There is moderate central spinal canal stenosis. There is moderate right foraminal stenosis and mild to moderate left foraminal stenosis  At L2-L3 there is a moderate disc bulge. There is bilateral facet hypertrophy and ligamentum flavum thickening. There is moderate central spinal canal stenosis. There is severe right foraminal stenosis. There is moderate to severe left foraminal stenosis.  At L1-L2 there is retrolisthesis. There is a right paracentral disc protrusion. The central spinal canal is mildly stenotic. There is bilateral moderate to severe foraminal stenosis. There is intervertebral disc space narrowing.  At T12-L1 there is no disc herniation, spinal canal or foraminal stenosis.  The conus medullaris ends at the level of L1. -------------  MRI LUMBAR SPINE WITHOUT AND WITH CONTRAST  TECHNIQUE: Multiplanar and multiecho pulse sequences of the lumbar spine were obtained without and with intravenous contrast.  CONTRAST: 63mL MULTIHANCE GADOBENATE DIMEGLUMINE 529 MG/ML IV SOLN  COMPARISON: [facility] lumbar radiographs 02/05/2013. Intraoperative films 01/17/2007.  FINDINGS: Same numbering system used as in 2008, designating the fused levels L4-L5 and L5-S1. Sequelae of posterior and interbody fusion at those levels with mild susceptibility artifact. Postoperative changes to the overlying posterior paraspinal soft tissues. Mildly exaggerated upper lumbar lordosis. No marrow edema or evidence of acute osseous abnormality.  Visualized lower thoracic spinal cord is normal with conus medularis at L1-L2. No abnormal enhancement identified.  Negative visualized abdominal viscera.  T10-T11: Mild facet hypertrophy.  T11-T12: Anterior disc osteophyte complex. No significant stenosis.  T12-L1:  Negative.  L1-L2: Disc bulge with superimposed right paracentral cephalad disc extrusion. Associated mild right lateral recess stenosis. No definite involvement of the right L1 foramen. No significant spinal stenosis at this level.  L2-L3: Broad-based disc protrusion with moderate facet and ligament flavum hypertrophy. Moderate spinal stenosis. Mild bilateral L2 foraminal stenosis.  L3-L4: Disc bulge with superimposed left paracentral cephalad disc extrusion (series 6, image 7 and series 4, image 15). Superimposed moderate to severe facet and ligament flavum hypertrophy. Overall mild spinal stenosis, mild left lateral recess stenosis, and mild left L3 foraminal stenosis.  L4-L5: Status post fusion and decompression. No stenosis.  L5-S1: Status post fusion and decompression. Small simple appearing fluid collection in the laminectomy space with no mass effect on the thecal sac. No stenosis.  IMPRESSION: 1. Sequelae of L4-L5 and L5-S1 fusion and decompression with no adverse features. 2. Adjacent segment disease at L3-L4 resulting in multifactorial mild spinal, left lateral recess, and left foraminal stenosis. 3. Similar to more pronounced upper lumbar disc degeneration, resulting in moderate multifactorial spinal stenosis at L2-L3, and mild right lateral recess stenosis at L1-L2.   Electronically Signed By: Lezlie Octave.D.  On: 02/20/2013 16:38  She reports that she has never smoked. She has never used smokeless tobacco. No results for input(s): HGBA1C, LABURIC in the last 8760 hours.  Objective:  VS:  HT:    WT:   BMI:     BP:(!) 157/91  HR:64bpm  TEMP: ( )  RESP:  Physical Exam  Constitutional: She is oriented to person, place, and time. She appears well-developed and well-nourished. No distress.  HENT:  Head: Normocephalic and atraumatic.  Nose: Nose normal.  Mouth/Throat: Oropharynx is clear and moist.  Eyes: Pupils are equal, round, and reactive to light.  Conjunctivae are normal.  Neck: Normal range of motion. Neck supple. No tracheal deviation present.  Cardiovascular: Regular rhythm and intact distal pulses.   Pulmonary/Chest: Effort normal. No respiratory distress.  Abdominal: She exhibits no distension. There is no guarding.  Musculoskeletal:  Patient is slow to stand from a seated position. She is very stiff with lumbar spine extension and rotation. She does have tenderness rocking across the vertebral bodies above the fusion. She has some taut bands in the thoracic spine but no real focal trigger point. She has some pain over the greater trochanters but again not really concordant pain. No pain with hip rotation. She has good distal strength without clonus.  Neurological: She is alert and oriented to person, place, and time. She exhibits normal muscle tone. Coordination normal.  Skin: Skin is warm. No rash noted. No erythema.  Psychiatric: She has a normal mood and affect. Her behavior is normal.  Nursing note and vitals reviewed.   Ortho Exam Imaging: No results found.  Past Medical/Family/Surgical/Social History: Medications & Allergies reviewed per EMR Patient Active Problem List   Diagnosis Date Noted  . Chronic right-sided thoracic back pain 12/21/2016  . Right-sided low back pain without sciatica 12/21/2016  . 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 Chronic pain syndrome 12/21/2016  . Calculus, ureter 12/21/2016  . Obesity 12/21/2016  . Abdominal aortic aneurysm ([place]) 07/04/2016  . Chronic pancreatitis ([place]) 07/04/2016  . Dizziness 07/04/2016  . 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 Essential hypertension 07/04/2016  . Gastroesophageal reflux disease 07/04/2016  . Osteoarthritis 07/04/2016  . History of abdominal aortic aneurysm (AAA) 06/30/2016  . Dry eyes 02/10/2016  . Nuclear sclerosis 02/10/2016  . Type 2 diabetes mellitus without complication ([place]) 99/83/3825  . Abdominal wall hernia 11/19/2015  . History of  congestive heart failure 08/18/2015  . 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 Encounter for pancreatic duct stent exchange 08/10/2015  . Fatty liver 08/10/2015  . Aneurysm of thoracic aorta ([place]) 07/20/2015  . DDD (degenerative disc disease), lumbar 07/20/2015  . Hyperlipidemia 12/31/2014  . HTN (hypertension), benign 10/10/2014  . Diabetes mellitus type II, controlled, with no complications ([place]) 05/39/7673  . History of coronary artery disease 12/25/2013  . White coat hypertension 12/25/2013  . GERD (gastroesophageal reflux disease) 10/08/2012  . Nonischemic cardiomyopathy ([place]) 08/30/2011  . Pancreatic calcification 03/31/2011  . Angina pectoris ([place]) 02/24/2011  . Osteopenia 12/26/2008  . Migraine headache 04/25/2008  . Fibromyalgia 10/06/2006  . Depression 06/27/2006   Past Medical History:  Diagnosis Date  . Arthritis   . Diabetes mellitus without complication ([place])   . Heart disease   . Osteoarthritis   . Osteoporosis    History reviewed. No pertinent family history. Past Surgical History:  Procedure Laterality Date  . SPINAL FUSION     L4 to the sacrum fusion by Dr. Sherwood Gambler in 2008   Social History  Occupational History  . Not on file.   Social History Main Topics  . Smoking status: Never Smoker  . Smokeless tobacco: Never Used  . Alcohol use No  . Drug use: Unknown  . Sexual activity: Not on file

## 2016-12-21 NOTE — Progress Notes (Deleted)
Hurting due to weather change, not as pain as she was in at last OV, She states that this week seem s the pain is lower in her back, but with the walking it is higher.  + driver, - Bt's, - Dye allergy

## 2016-12-21 NOTE — Patient Instructions (Signed)

## 2017-01-02 NOTE — Procedures (Signed)
[name] is a 76 year old female with prior lumbar fusion at L4-5 with more lower thoracic upper lumbar pain.  We recently saw her for evaluation and decided to complete 3 levels facet joint block in the thoracic spine.  Looking at levels today where she hurts this is more over the T11-12 and T12-L1 and L1-2.  Thoracic Diagnostic Facet Joint Nerve Block with Fluoroscopic Guidance  Patient: Zoe Fleming      Date of Birth: 05-01-1940 MRN: [id number] PCP: Patient, No Pcp Per      Visit Date: 12/21/2016   Universal Protocol:    Date/Time: 10/29/185:56 AM  Consent Given By: the patient  Position: prone  Additional Comments: Vital signs were monitored before and after the procedure. Patient was prepped and draped in the usual sterile fashion. The correct patient, procedure, and site was verified.   Injection Procedure Details:  Procedure Site One Meds Administered:  Meds ordered this encounter  Medications  . lidocaine (PF) (XYLOCAINE) 1 % injection 2 mL  . methylPREDNISolone acetate (DEPO-MEDROL) injection 80 mg     Laterality: Right  Location/Site:  T11-12 T12-L1 L1-L2  Needle size: 22 G  Needle type: spinal needle   Findings:  -Contrast Used: 1 mL iohexol 180 mg iodine/mL   -Comments: Excellent flow contrast periarticular over the articular processes.  Procedure Details: The fluoroscope beam is vertically oriented and tilted cranially and caudally to square off the vertebral body endplates to achieve a true AP midline view.  The skin over the target area of the pedicle shadow ipsilateral to the desired medial branch nerve was locally anesthetized with a 1 ml volume of 1% Lidocaine without Epinephrine.  A 22 gauge spinal needle was inserted and advanced in a trajectory view down to the target.   After contact with periosteum and negative aspirate for blood and CSF, correct placement without intravascular or epidural spread was confirmed by injecting 0.5 ml. of  Omnipaque-240.  A spot radiograph was obtained of this image.  Next, a 0.5 ml. volume of the injectate described above was injected. The needle was then redirected to the other facet joint nerves mentioned above if needed.  Prior to the procedure, the patient was given a Pain Diary which was completed for baseline measurements.  After the procedure, the patient rated their pain every 30 minutes and will continue rating at this frequency for a total of 5 hours.  The patient has been asked to complete the Diary and return to [place] by mail, fax or hand delivered as soon as possible.   Additional Comments:  The patient tolerated the procedure well Dressing: Band-Aid    Post-procedure details: Patient was observed during the procedure. Post-procedure instructions were reviewed.  Patient left the clinic in stable condition.

## 2017-01-12 ENCOUNTER — Ambulatory Visit (INDEPENDENT_AMBULATORY_CARE_PROVIDER_SITE_OTHER): Payer: Medicare Other | Admitting: Physical Medicine and Rehabilitation

## 2017-01-16 ENCOUNTER — Encounter (INDEPENDENT_AMBULATORY_CARE_PROVIDER_SITE_OTHER): Payer: Self-pay | Admitting: Physical Medicine and Rehabilitation

## 2017-01-16 ENCOUNTER — Ambulatory Visit (INDEPENDENT_AMBULATORY_CARE_PROVIDER_SITE_OTHER): Payer: Medicare Other | Admitting: Physical Medicine and Rehabilitation

## 2017-01-16 VITALS — BP 130/89 | HR 85

## 2017-01-16 DIAGNOSIS — M961 Postlaminectomy syndrome, not elsewhere classified: Secondary | ICD-10-CM | POA: Diagnosis not present

## 2017-01-16 DIAGNOSIS — M797 Fibromyalgia: Secondary | ICD-10-CM

## 2017-01-16 DIAGNOSIS — M546 Pain in thoracic spine: Secondary | ICD-10-CM | POA: Diagnosis not present

## 2017-01-16 DIAGNOSIS — M47816 Spondylosis without myelopathy or radiculopathy, lumbar region: Secondary | ICD-10-CM | POA: Diagnosis not present

## 2017-01-16 DIAGNOSIS — M545 Low back pain, unspecified: Secondary | ICD-10-CM

## 2017-01-16 DIAGNOSIS — G8929 Other chronic pain: Secondary | ICD-10-CM | POA: Diagnosis not present

## 2017-01-16 DIAGNOSIS — G894 Chronic pain syndrome: Secondary | ICD-10-CM

## 2017-01-16 NOTE — Progress Notes (Deleted)
Injection really helped; she is very pleased with the results. Standing and walking is much easier.

## 2017-01-18 ENCOUNTER — Encounter (INDEPENDENT_AMBULATORY_CARE_PROVIDER_SITE_OTHER): Payer: Self-pay | Admitting: Physical Medicine and Rehabilitation

## 2017-01-18 NOTE — Progress Notes (Signed)
Wells Gerdeman - 76 y.o. female MRN [id number]  Date of birth: 1940-11-16  Office Visit Note: Visit Date: 01/16/2017 PCP: Patient, No Pcp Per Referred by: No ref. provider found  Subjective: Chief Complaint  Patient presents with  . Lower Back - Pain  . Middle Back - Pain   HPI: Zoe Fleming is a very pleasant but complicated 76 year old chronic pain patient.  She does travel from [place] for her care.  She has had prior lumbar fusion from L4 to the sacrum by Dr. Sherwood Gambler in 2008.  She has some MRI evidence of adjacent level disease with stenosis and this was a couple years ago.  We saw her with more mid back pain but there was really on the right side at about the T12 level.  We completed facet joint injections on the right side she returns today after having had the procedure.  Overall she is quite pleased and pleasant with the results.  She says she really has no pain on the right side now.  She still ambulates with a cane and has her general amount of pain.  This is also complicated by history of fibromyalgia.  She reports some left-sided symptoms but not nearly as bad as the right.  Overall is not limiting what she normally does.  She has no new numbness or tingling or weakness.  No new issues.  Overall she is doing quite well.  She did ask if she could possibly have left-sided injection sometime before Christmas.  We did talk about the fact that if it was doing okay right now would not chance of injection just from a risk-benefit standpoint.  I did tell her though if it starts to worsen between now and then or if the right side does as well we could complete bilateral procedure.    Review of Systems  Constitutional: Positive for malaise/fatigue. Negative for chills, fever and weight loss.  HENT: Negative for hearing loss and sinus pain.   Eyes: Negative for blurred vision, double vision and photophobia.  Respiratory: Negative for cough and shortness of breath.   Cardiovascular: Negative for  chest pain, palpitations and leg swelling.  Gastrointestinal: Negative for abdominal pain, nausea and vomiting.  Genitourinary: Negative for flank pain.  Musculoskeletal: Positive for back pain. Negative for myalgias.  Skin: Negative for itching and rash.  Neurological: Positive for weakness. Negative for tremors and focal weakness.  Endo/Heme/Allergies: Negative.   Psychiatric/Behavioral: Negative for depression.  All other systems reviewed and are negative.  Otherwise per HPI.  Assessment & Plan: Visit Diagnoses:  1. Spondylosis without myelopathy or radiculopathy, lumbar region   2. Chronic right-sided thoracic back pain   3. Chronic right-sided low back pain without sciatica   4. Post laminectomy syndrome   5. Chronic pain syndrome   6. Fibromyalgia     Plan: Findings:  Complicated chronic mid back and low back pain in a patient with chronic pain syndrome and fibromyalgia and prior lumbar fusion.  She actually does fairly well given all the problems that she has.  She does travel from [place] for her care.  She did extremely well with facet joint blocks noted on the last note in the lower thoracic upper lumbar region.  She does have MRI evidence of adjacent level problems in the lumbar spine but no real particular complaints of this.  For now we will see how she does.  If her left-sided symptoms start to worsen then we would look at the same procedure on the left  side.  We could likely get her in before Christmas if we needed to.  Otherwise she is going to continue with her normal level of care follow with primary care physicians.    Meds & Orders: No orders of the defined types were placed in this encounter.  No orders of the defined types were placed in this encounter.   Follow-up: Return if symptoms worsen or fail to improve.   Procedures: No procedures performed  No notes on file   Clinical History: XR right Ribs, 4 View. 06/2016  CLINICAL HISTORYrib  pain,  COMPARISON: CR\SR- XR CHEST 2 VWS- 02/17/2014 04:35 PM EST   FINDINGS:  LUNGS: No focal airspace consolidation.There is persistent elevation of the right hemidiaphragm, this is unchanged compared to prior imaging.  PLEURAL SPACES: No pleural effusion. No pneumothorax.   BONES: No visible acute rib fracture.   LIMITED ABDOMEN: Pigtail catheter projects over the upper abdomen.Surgical clips are noted within the right upper quadrant of the abdomen.Lumbar spinal fusion hardware is partially visualized.  IMPRESSION:  No visible acute rib fracture. No pneumothorax.Stable elevation of the right hemidiaphragm -------- Lumbar MRI 05/21/2014  CLINICAL HISTORY: Right Sided numbness with out sciatica, numbness and tingling.   TECHNIQUE:An MRI examination of the lumbar spine was performed utilizing multiplanar and multi sequential imaging with and without contrast. 8 cc of gadavist was administered.  COMPARISON STUDY:None  FINDINGS:   There has been a posterior fusion from L4-S1. There are interbody disc spacers present at L4-L5 and L5-S1. Chronic loss of height of T11.  At L5-S1 is been a right hemilaminectomy. There is no central spinal canal stenosis. The neural foramina are not stenotic. There is no abnormal enhancing granulation tissue at this disc space level. There is a 2.7 x 1.1 cm nonenhancing fluid collection posterior and lateral to the thecal sac which may represent a small seroma.  At L4-L5 there has been a wide laminectomy. There is no central spinal canal stenosis. The neural foramina are not stenotic.  At L3-L4 there is a moderate concentric disc bulge. There is bilateral facet hypertrophy and ligamentum flavum thickening. There is moderate central spinal canal stenosis. There is moderate right foraminal stenosis and mild to moderate left foraminal stenosis  At L2-L3 there is a moderate disc bulge. There is bilateral facet hypertrophy and ligamentum  flavum thickening. There is moderate central spinal canal stenosis. There is severe right foraminal stenosis. There is moderate to severe left foraminal stenosis.  At L1-L2 there is retrolisthesis. There is a right paracentral disc protrusion. The central spinal canal is mildly stenotic. There is bilateral moderate to severe foraminal stenosis. There is intervertebral disc space narrowing.  At T12-L1 there is no disc herniation, spinal canal or foraminal stenosis.  The conus medullaris ends at the level of L1. -------------  MRI LUMBAR SPINE WITHOUT AND WITH CONTRAST  TECHNIQUE: Multiplanar and multiecho pulse sequences of the lumbar spine were obtained without and with intravenous contrast.  CONTRAST: 55mL MULTIHANCE GADOBENATE DIMEGLUMINE 529 MG/ML IV SOLN  COMPARISON: [facility] lumbar radiographs 02/05/2013. Intraoperative films 01/17/2007.  FINDINGS: Same numbering system used as in 2008, designating the fused levels L4-L5 and L5-S1. Sequelae of posterior and interbody fusion at those levels with mild susceptibility artifact. Postoperative changes to the overlying posterior paraspinal soft tissues. Mildly exaggerated upper lumbar lordosis. No marrow edema or evidence of acute osseous abnormality.  Visualized lower thoracic spinal cord is normal with conus medularis at L1-L2. No abnormal enhancement identified.  Negative visualized abdominal viscera.  T10-T11: Mild facet hypertrophy.  T11-T12: Anterior disc osteophyte complex. No significant stenosis.  T12-L1: Negative.  L1-L2: Disc bulge with superimposed right paracentral cephalad disc extrusion. Associated mild right lateral recess stenosis. No definite involvement of the right L1 foramen. No significant spinal stenosis at this level.  L2-L3: Broad-based disc protrusion with moderate facet and ligament flavum hypertrophy. Moderate spinal stenosis. Mild bilateral L2 foraminal  stenosis.  L3-L4: Disc bulge with superimposed left paracentral cephalad disc extrusion (series 6, image 7 and series 4, image 15). Superimposed moderate to severe facet and ligament flavum hypertrophy. Overall mild spinal stenosis, mild left lateral recess stenosis, and mild left L3 foraminal stenosis.  L4-L5: Status post fusion and decompression. No stenosis.  L5-S1: Status post fusion and decompression. Small simple appearing fluid collection in the laminectomy space with no mass effect on the thecal sac. No stenosis.  IMPRESSION: 1. Sequelae of L4-L5 and L5-S1 fusion and decompression with no adverse features. 2. Adjacent segment disease at L3-L4 resulting in multifactorial mild spinal, left lateral recess, and left foraminal stenosis. 3. Similar to more pronounced upper lumbar disc degeneration, resulting in moderate multifactorial spinal stenosis at L2-L3, and mild right lateral recess stenosis at L1-L2.   Electronically Signed By: Lars Pinks M.D. On: 02/20/2013 16:38  She reports that  has never smoked. she has never used smokeless tobacco. No results for input(s): HGBA1C, LABURIC in the last 8760 hours.  Objective:  VS:  HT:    WT:   BMI:     BP:130/89  HR:85bpm  TEMP: ( )  RESP:  Physical Exam  Constitutional: She is oriented to person, place, and time. She appears well-developed and well-nourished.  Eyes: Conjunctivae and EOM are normal. Pupils are equal, round, and reactive to light.  Cardiovascular: Normal rate and intact distal pulses.  Pulmonary/Chest: Effort normal.  Musculoskeletal:  Patient ambulates with a cane.  She is slow to rise from a seated position.  She has no pain with hip rotation.  She does have tender points along the greater trochanters.  She has some the paraspinal region of the mid back region.  Neurological: She is alert and oriented to person, place, and time. She exhibits normal muscle tone.  Skin: Skin is warm and dry. No rash  noted. No erythema.  Psychiatric: She has a normal mood and affect. Her behavior is normal.  Nursing note and vitals reviewed.   Ortho Exam Imaging: No results found.  Past Medical/Family/Surgical/Social History: Medications & Allergies reviewed per EMR Patient Active Problem List   Diagnosis Date Noted  . Chronic right-sided thoracic back pain 12/21/2016  . Right-sided low back pain without sciatica 12/21/2016  . 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 Chronic pain syndrome 12/21/2016  . Calculus, ureter 12/21/2016  . Obesity 12/21/2016  . Abdominal aortic aneurysm ([place]) 07/04/2016  . Chronic pancreatitis ([place]) 07/04/2016  . Dizziness 07/04/2016  . 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 Essential hypertension 07/04/2016  . Gastroesophageal reflux disease 07/04/2016  . Osteoarthritis 07/04/2016  . History of abdominal aortic aneurysm (AAA) 06/30/2016  . Dry eyes 02/10/2016  . Nuclear sclerosis 02/10/2016  . Type 2 diabetes mellitus without complication ([place]) 93/71/6967  . Abdominal wall hernia 11/19/2015  . History of congestive heart failure 08/18/2015  . 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 Encounter for pancreatic duct stent exchange 08/10/2015  . Fatty liver 08/10/2015  . Aneurysm of thoracic aorta ([place]) 07/20/2015  . DDD (degenerative disc disease), lumbar 07/20/2015  . Hyperlipidemia 12/31/2014  . HTN (hypertension),  benign 10/10/2014  . Diabetes mellitus type II, controlled, with no complications ([place]) 44/03/270  . History of coronary artery disease 12/25/2013  . White coat hypertension 12/25/2013  . GERD (gastroesophageal reflux disease) 10/08/2012  . Nonischemic cardiomyopathy ([place]) 08/30/2011  . Pancreatic calcification 03/31/2011  . Angina pectoris ([place]) 02/24/2011  . Osteopenia 12/26/2008  . Migraine headache 04/25/2008  . Fibromyalgia 10/06/2006  . Depression 06/27/2006   Past Medical History:  Diagnosis Date  . Arthritis   . Diabetes mellitus  without complication ([place])   . Heart disease   . Osteoarthritis   . Osteoporosis    History reviewed. No pertinent family history. Past Surgical History:  Procedure Laterality Date  . SPINAL FUSION     L4 to the sacrum fusion by Dr. Sherwood Gambler in 2008   Social History   Occupational History  . Not on file  Tobacco Use  . Smoking status: Never Smoker  . Smokeless tobacco: Never Used  Substance and Sexual Activity  . Alcohol use: No  . Drug use: Not on file  . Sexual activity: Not on file

## 2017-02-02 ENCOUNTER — Telehealth (INDEPENDENT_AMBULATORY_CARE_PROVIDER_SITE_OTHER): Payer: Self-pay | Admitting: Physical Medicine and Rehabilitation

## 2017-02-02 NOTE — Telephone Encounter (Signed)
She wanted left sided injection before Christmas pur my last note, we saw her earlier in the month

## 2017-02-02 NOTE — Telephone Encounter (Signed)
Left message for patient to call back to schedule.  °

## 2017-02-15 NOTE — Telephone Encounter (Signed)
Scheduled

## 2017-02-23 ENCOUNTER — Encounter (INDEPENDENT_AMBULATORY_CARE_PROVIDER_SITE_OTHER): Payer: Self-pay | Admitting: Physical Medicine and Rehabilitation

## 2017-02-23 ENCOUNTER — Ambulatory Visit (INDEPENDENT_AMBULATORY_CARE_PROVIDER_SITE_OTHER): Payer: Medicare Other | Admitting: Physical Medicine and Rehabilitation

## 2017-02-23 ENCOUNTER — Ambulatory Visit (INDEPENDENT_AMBULATORY_CARE_PROVIDER_SITE_OTHER): Payer: Medicare Other

## 2017-02-23 VITALS — BP 125/87 | HR 87 | Temp 98.0°F

## 2017-02-23 DIAGNOSIS — M797 Fibromyalgia: Secondary | ICD-10-CM | POA: Diagnosis not present

## 2017-02-23 DIAGNOSIS — M961 Postlaminectomy syndrome, not elsewhere classified: Secondary | ICD-10-CM | POA: Diagnosis not present

## 2017-02-23 DIAGNOSIS — M47816 Spondylosis without myelopathy or radiculopathy, lumbar region: Secondary | ICD-10-CM

## 2017-02-23 DIAGNOSIS — F329 Major depressive disorder, single episode, unspecified: Secondary | ICD-10-CM | POA: Diagnosis not present

## 2017-02-23 DIAGNOSIS — G894 Chronic pain syndrome: Secondary | ICD-10-CM | POA: Diagnosis not present

## 2017-02-23 MED ORDER — BUPIVACAINE HCL 0.5 % IJ SOLN: 3.0000 mL | Freq: Once | INTRAMUSCULAR | Status: DC

## 2017-02-23 MED ORDER — METHYLPREDNISOLONE ACETATE 80 MG/ML IJ SUSP: 80.0000 mg | Freq: Once | INTRAMUSCULAR | Status: DC

## 2017-02-23 NOTE — Patient Instructions (Signed)

## 2017-02-23 NOTE — Procedures (Signed)
Zoe Fleming is a 76 year old female that we saw a month or so ago and completed lower thoracic facet joint block with good relief.  She comes in today with continued more lower back pain that seems to be located probably above her fusion.  Her case is complicated by fibromyalgia which I do think is a significant impact on her clinical case and trying to get her some pain relief.  She does travel from [place] which makes it a little more difficult as well.  I think diagnostic facet joint blocks above her fusion may help.  If it does seem to help we could possibly look at continuing with a workup for radiofrequency ablation.  Ultimately she might be a candidate for spinal cord stimulator trial.  She denies any radicular leg pain or new trauma.  Lumbar Facet Joint Intra-Articular Injection(s) with Fluoroscopic Guidance  Patient: Zoe Fleming      Date of Birth: 1940-12-01 MRN: [id number] PCP: Patient, No Pcp Per      Visit Date: 02/23/2017   Universal Protocol:    Date/Time: 02/23/2017  Consent Given By: the patient  Position: PRONE   Additional Comments: Vital signs were monitored before and after the procedure. Patient was prepped and draped in the usual sterile fashion. The correct patient, procedure, and site was verified.   Injection Procedure Details:  Procedure Site One Meds Administered:  Meds ordered this encounter  Medications  . bupivacaine (MARCAINE) 0.5 % (with pres) injection 3 mL  . methylPREDNISolone acetate (DEPO-MEDROL) injection 80 mg     Laterality: Bilateral  Location/Site:  L4-L5  Needle size: 22 guage  Needle type: Spinal  Needle Placement: Articular  Findings:  -Comments: Excellent flow of contrast producing a partial arthrogram.  Procedure Details: The fluoroscope beam is vertically oriented in AP, and the inferior recess is visualized beneath the lower pole of the inferior apophyseal process, which represents the target point for needle  insertion. When direct visualization is difficult the target point is located at the medial projection of the vertebral pedicle. The region overlying each aforementioned target is locally anesthetized with a 1 to 2 ml. volume of 1% Lidocaine without Epinephrine.   The spinal needle was inserted into each of the above mentioned facet joints using biplanar fluoroscopic guidance. A 0.25 to 0.5 ml. volume of Isovue-250 was injected and a partial facet joint arthrogram was obtained. A single spot film was obtained of the resulting arthrogram.    One to 1.25 ml of the steroid/anesthetic solution was then injected into each of the facet joints noted above.   Additional Comments:  The patient tolerated the procedure well No complications occurred Dressing: Band-Aid    Post-procedure details: Patient was observed during the procedure. Post-procedure instructions were reviewed.  Patient left the clinic in stable condition.  Pertinent Imaging: XR right Ribs, 4 View. 06/2016  CLINICAL HISTORYrib pain,  COMPARISON: CR\SR- XR CHEST 2 VWS- 02/17/2014 04:35 PM EST   FINDINGS:  LUNGS: No focal airspace consolidation.There is persistent elevation of the right hemidiaphragm, this is unchanged compared to prior imaging.  PLEURAL SPACES: No pleural effusion. No pneumothorax.   BONES: No visible acute rib fracture.   LIMITED ABDOMEN: Pigtail catheter projects over the upper abdomen.Surgical clips are noted within the right upper quadrant of the abdomen.Lumbar spinal fusion hardware is partially visualized.  IMPRESSION:  No visible acute rib fracture. No pneumothorax.Stable elevation of the right hemidiaphragm -------- Lumbar MRI 05/21/2014  CLINICAL HISTORY: Right Sided numbness with out sciatica, numbness and  tingling.   TECHNIQUE:An MRI examination of the lumbar spine was performed utilizing multiplanar and multi sequential imaging with and without contrast. 8 cc of gadavist  was administered.  COMPARISON STUDY:None  FINDINGS:   There has been a posterior fusion from L4-S1. There are interbody disc spacers present at L4-L5 and L5-S1. Chronic loss of height of T11.  At L5-S1 is been a right hemilaminectomy. There is no central spinal canal stenosis. The neural foramina are not stenotic. There is no abnormal enhancing granulation tissue at this disc space level. There is a 2.7 x 1.1 cm nonenhancing fluid collection posterior and lateral to the thecal sac which may represent a small seroma.  At L4-L5 there has been a wide laminectomy. There is no central spinal canal stenosis. The neural foramina are not stenotic.  At L3-L4 there is a moderate concentric disc bulge. There is bilateral facet hypertrophy and ligamentum flavum thickening. There is moderate central spinal canal stenosis. There is moderate right foraminal stenosis and mild to moderate left foraminal stenosis  At L2-L3 there is a moderate disc bulge. There is bilateral facet hypertrophy and ligamentum flavum thickening. There is moderate central spinal canal stenosis. There is severe right foraminal stenosis. There is moderate to severe left foraminal stenosis.  At L1-L2 there is retrolisthesis. There is a right paracentral disc protrusion. The central spinal canal is mildly stenotic. There is bilateral moderate to severe foraminal stenosis. There is intervertebral disc space narrowing.  At T12-L1 there is no disc herniation, spinal canal or foraminal stenosis.  The conus medullaris ends at the level of L1. -------------  MRI LUMBAR SPINE WITHOUT AND WITH CONTRAST  TECHNIQUE: Multiplanar and multiecho pulse sequences of the lumbar spine were obtained without and with intravenous contrast.  CONTRAST: 73mL MULTIHANCE GADOBENATE DIMEGLUMINE 529 MG/ML IV SOLN  COMPARISON: [facility] lumbar radiographs 02/05/2013. Intraoperative films 01/17/2007.  FINDINGS: Same numbering system used  as in 2008, designating the fused levels L4-L5 and L5-S1. Sequelae of posterior and interbody fusion at those levels with mild susceptibility artifact. Postoperative changes to the overlying posterior paraspinal soft tissues. Mildly exaggerated upper lumbar lordosis. No marrow edema or evidence of acute osseous abnormality.  Visualized lower thoracic spinal cord is normal with conus medularis at L1-L2. No abnormal enhancement identified.  Negative visualized abdominal viscera.  T10-T11: Mild facet hypertrophy.  T11-T12: Anterior disc osteophyte complex. No significant stenosis.  T12-L1: Negative.  L1-L2: Disc bulge with superimposed right paracentral cephalad disc extrusion. Associated mild right lateral recess stenosis. No definite involvement of the right L1 foramen. No significant spinal stenosis at this level.  L2-L3: Broad-based disc protrusion with moderate facet and ligament flavum hypertrophy. Moderate spinal stenosis. Mild bilateral L2 foraminal stenosis.  L3-L4: Disc bulge with superimposed left paracentral cephalad disc extrusion (series 6, image 7 and series 4, image 15). Superimposed moderate to severe facet and ligament flavum hypertrophy. Overall mild spinal stenosis, mild left lateral recess stenosis, and mild left L3 foraminal stenosis.  L4-L5: Status post fusion and decompression. No stenosis.  L5-S1: Status post fusion and decompression. Small simple appearing fluid collection in the laminectomy space with no mass effect on the thecal sac. No stenosis.  IMPRESSION: 1. Sequelae of L4-L5 and L5-S1 fusion and decompression with no adverse features. 2. Adjacent segment disease at L3-L4 resulting in multifactorial mild spinal, left lateral recess, and left foraminal stenosis. 3. Similar to more pronounced upper lumbar disc degeneration, resulting in moderate multifactorial spinal stenosis at L2-L3, and mild right lateral recess stenosis at  L1-L2.   Electronically Signed By: Lars Pinks M.D. On: 02/20/2013 16:38

## 2017-02-23 NOTE — Progress Notes (Deleted)
Pt states pain in lower back, pain increases when walking and standing. +Driver, -BT, -Dye Allergy.

## 2017-03-15 NOTE — Progress Notes (Signed)
Zoe Fleming - 77 y.o. female MRN [id number]  Date of birth: 11-02-1940  Office Visit Note: Visit Date: 02/23/2017 PCP: Patient, No Pcp Per Referred by: No ref. provider found  Subjective: Chief Complaint  Patient presents with  . Lower Back - Pain   HPI:  Zoe Fleming is a very pleasant but complicated 77 year old chronic pain patient.  She does travel from [place] for her care.  She has had prior lumbar fusion from L4 to the sacrum by Dr. Sherwood Gambler in 2008.  She has some MRI evidence of adjacent level disease with stenosis and this was a couple years ago.  We saw her with more mid back pain but there was really on the right side at about the T12 level.  We completed facet joint injections on the right side she returns today after having had the procedure.  Overall she is quite pleased and pleasant with the results.  She says she really has no pain on the right side now.  She still ambulates with a cane and has her general amount of pain.  This is also complicated by history of fibromyalgia.  She reports some left-sided symptoms but not nearly as bad as the right.  We saw her at a follow-up appointment after these facet joint blocks and she was doing fairly well.  She comes in today with continued more low back pain really across the lumbar area likely above her fusion.  She has some pain across the buttocks which is likely sacroiliac joint mediated pain from the fusion.  And again this is all clouded by fibromyalgia which we had a long talk today about with her and her daughter.  The fibromyalgia really does cloud the issue with her from a pain standpoint.  We also talked about the traveling from [place] here in terms of treatment.  She has had no new falls or trauma.  She denies any radicular pain.  Again ambulates with a cane and tries to stay active.  Her pain is essentially very constant and widespread.  She is fatigued and tired at times.    Review of Systems  Constitutional: Positive for  malaise/fatigue. Negative for chills, fever and weight loss.  HENT: Negative for hearing loss and sinus pain.   Eyes: Negative for blurred vision, double vision and photophobia.  Respiratory: Negative for cough and shortness of breath.   Cardiovascular: Negative for chest pain, palpitations and leg swelling.  Gastrointestinal: Negative for abdominal pain, nausea and vomiting.  Genitourinary: Negative for flank pain.  Musculoskeletal: Positive for back pain and joint pain. Negative for myalgias.  Skin: Negative for itching and rash.  Neurological: Negative for tremors, focal weakness and weakness.  Endo/Heme/Allergies: Negative.   Psychiatric/Behavioral: Negative for depression.  All other systems reviewed and are negative.  Otherwise per HPI.  Assessment & Plan: Visit Diagnoses:  1. Spondylosis without myelopathy or radiculopathy, lumbar region   2. Post laminectomy syndrome   3. Fibromyalgia   4. Major depressive disorder, remission status unspecified, unspecified whether recurrent   5. Chronic pain syndrome     Plan: Findings:  Chronic pain syndrome and fibromyalgia with prior lumbar fusion to the sacrum with adjacent level disease on 2006 MRI.  Pain complaints are really all over the place in terms of her back pain.  We have completed injections in the thoracic spine which I feel like is more muscular probably than arthritic but it did seem to help.  She likely needs to regroup with physical therapy for her  thoracic pain but really at this point she talks about having had multiple bouts of physical therapy.  In terms of her low back pain which is worse today I think this is adjacent level problems with probably facet arthropathy facet mediated pain again clouded by fibromyalgia.  She does have moderate stenosis at that level as well.  She is having no leg pain or claudication.  I think the best approach is to complete bilateral facet joint blocks today.  She travels from [place] so we  will try to do that today.  Traveling from [place] also makes this case very complicated.  In terms of her fibromyalgia we went over this at length today and probably spent 25 minutes only talking about treatment for fibromyalgia.  Standard treatment really is sleep hygiene as well as activity level and it is really not standard to use opioid therapy for the fibromyalgia alone.  We did talk about mindfulness training as well as massage.  We will see how she does with the facet joint blocks that she may be a candidate for radiofrequency ablation.  I did discuss that this would not give her 100% relief which I think is what she is looking for.  She may ultimately be a candidate for spinal cord stimulator trial.    Meds & Orders:  Meds ordered this encounter  Medications  . bupivacaine (MARCAINE) 0.5 % (with pres) injection 3 mL  . methylPREDNISolone acetate (DEPO-MEDROL) injection 80 mg    Orders Placed This Encounter  Procedures  . Facet Injection  . XR C-ARM NO REPORT    Follow-up: No Follow-up on file.   Procedures: No procedures performed  Zoe Fleming is a 77 year old female that we saw a month or so ago and completed lower thoracic facet joint block with good relief.  She comes in today with continued more lower back pain that seems to be located probably above her fusion.  Her case is complicated by fibromyalgia which I do think is a significant impact on her clinical case and trying to get her some pain relief.  She does travel from [place] which makes it a little more difficult as well.  I think diagnostic facet joint blocks above her fusion may help.  If it does seem to help we could possibly look at continuing with a workup for radiofrequency ablation.  Ultimately she might be a candidate for spinal cord stimulator trial.  She denies any radicular leg pain or new trauma.  Lumbar Facet Joint Intra-Articular Injection(s) with Fluoroscopic Guidance  Patient: Zoe Fleming      Date of  Birth: 10/10/1940 MRN: [id number] PCP: Patient, No Pcp Per      Visit Date: 02/23/2017   Universal Protocol:    Date/Time: 02/23/2017  Consent Given By: the patient  Position: PRONE   Additional Comments: Vital signs were monitored before and after the procedure. Patient was prepped and draped in the usual sterile fashion. The correct patient, procedure, and site was verified.   Injection Procedure Details:  Procedure Site One Meds Administered:  Meds ordered this encounter  Medications  . bupivacaine (MARCAINE) 0.5 % (with pres) injection 3 mL  . methylPREDNISolone acetate (DEPO-MEDROL) injection 80 mg     Laterality: Bilateral  Location/Site:  L4-L5  Needle size: 22 guage  Needle type: Spinal  Needle Placement: Articular  Findings:  -Comments: Excellent flow of contrast producing a partial arthrogram.  Procedure Details: The fluoroscope beam is vertically oriented in AP, and the inferior recess is  visualized beneath the lower pole of the inferior apophyseal process, which represents the target point for needle insertion. When direct visualization is difficult the target point is located at the medial projection of the vertebral pedicle. The region overlying each aforementioned target is locally anesthetized with a 1 to 2 ml. volume of 1% Lidocaine without Epinephrine.   The spinal needle was inserted into each of the above mentioned facet joints using biplanar fluoroscopic guidance. A 0.25 to 0.5 ml. volume of Isovue-250 was injected and a partial facet joint arthrogram was obtained. A single spot film was obtained of the resulting arthrogram.    One to 1.25 ml of the steroid/anesthetic solution was then injected into each of the facet joints noted above.   Additional Comments:  The patient tolerated the procedure well No complications occurred Dressing: Band-Aid    Post-procedure details: Patient was observed during the procedure. Post-procedure instructions  were reviewed.  Patient left the clinic in stable condition.  Pertinent Imaging: XR right Ribs, 4 View. 06/2016  CLINICAL HISTORYrib pain,  COMPARISON: CR\SR- XR CHEST 2 VWS- 02/17/2014 04:35 PM EST   FINDINGS:  LUNGS: No focal airspace consolidation.There is persistent elevation of the right hemidiaphragm, this is unchanged compared to prior imaging.  PLEURAL SPACES: No pleural effusion. No pneumothorax.   BONES: No visible acute rib fracture.   LIMITED ABDOMEN: Pigtail catheter projects over the upper abdomen.Surgical clips are noted within the right upper quadrant of the abdomen.Lumbar spinal fusion hardware is partially visualized.  IMPRESSION:  No visible acute rib fracture. No pneumothorax.Stable elevation of the right hemidiaphragm -------- Lumbar MRI 05/21/2014  CLINICAL HISTORY: Right Sided numbness with out sciatica, numbness and tingling.   TECHNIQUE:An MRI examination of the lumbar spine was performed utilizing multiplanar and multi sequential imaging with and without contrast. 8 cc of gadavist was administered.  COMPARISON STUDY:None  FINDINGS:   There has been a posterior fusion from L4-S1. There are interbody disc spacers present at L4-L5 and L5-S1. Chronic loss of height of T11.  At L5-S1 is been a right hemilaminectomy. There is no central spinal canal stenosis. The neural foramina are not stenotic. There is no abnormal enhancing granulation tissue at this disc space level. There is a 2.7 x 1.1 cm nonenhancing fluid collection posterior and lateral to the thecal sac which may represent a small seroma.  At L4-L5 there has been a wide laminectomy. There is no central spinal canal stenosis. The neural foramina are not stenotic.  At L3-L4 there is a moderate concentric disc bulge. There is bilateral facet hypertrophy and ligamentum flavum thickening. There is moderate central spinal canal stenosis. There is moderate right foraminal stenosis  and mild to moderate left foraminal stenosis  At L2-L3 there is a moderate disc bulge. There is bilateral facet hypertrophy and ligamentum flavum thickening. There is moderate central spinal canal stenosis. There is severe right foraminal stenosis. There is moderate to severe left foraminal stenosis.  At L1-L2 there is retrolisthesis. There is a right paracentral disc protrusion. The central spinal canal is mildly stenotic. There is bilateral moderate to severe foraminal stenosis. There is intervertebral disc space narrowing.  At T12-L1 there is no disc herniation, spinal canal or foraminal stenosis.  The conus medullaris ends at the level of L1. -------------  MRI LUMBAR SPINE WITHOUT AND WITH CONTRAST  TECHNIQUE: Multiplanar and multiecho pulse sequences of the lumbar spine were obtained without and with intravenous contrast.  CONTRAST: 60mL MULTIHANCE GADOBENATE DIMEGLUMINE 529 MG/ML IV SOLN  COMPARISON: [place]  [facility] lumbar radiographs 02/05/2013. Intraoperative films 01/17/2007.  FINDINGS: Same numbering system used as in 2008, designating the fused levels L4-L5 and L5-S1. Sequelae of posterior and interbody fusion at those levels with mild susceptibility artifact. Postoperative changes to the overlying posterior paraspinal soft tissues. Mildly exaggerated upper lumbar lordosis. No marrow edema or evidence of acute osseous abnormality.  Visualized lower thoracic spinal cord is normal with conus medularis at L1-L2. No abnormal enhancement identified.  Negative visualized abdominal viscera.  T10-T11: Mild facet hypertrophy.  T11-T12: Anterior disc osteophyte complex. No significant stenosis.  T12-L1: Negative.  L1-L2: Disc bulge with superimposed right paracentral cephalad disc extrusion. Associated mild right lateral recess stenosis. No definite involvement of the right L1 foramen. No significant spinal stenosis at this level.  L2-L3: Broad-based  disc protrusion with moderate facet and ligament flavum hypertrophy. Moderate spinal stenosis. Mild bilateral L2 foraminal stenosis.  L3-L4: Disc bulge with superimposed left paracentral cephalad disc extrusion (series 6, image 7 and series 4, image 15). Superimposed moderate to severe facet and ligament flavum hypertrophy. Overall mild spinal stenosis, mild left lateral recess stenosis, and mild left L3 foraminal stenosis.  L4-L5: Status post fusion and decompression. No stenosis.  L5-S1: Status post fusion and decompression. Small simple appearing fluid collection in the laminectomy space with no mass effect on the thecal sac. No stenosis.  IMPRESSION: 1. Sequelae of L4-L5 and L5-S1 fusion and decompression with no adverse features. 2. Adjacent segment disease at L3-L4 resulting in multifactorial mild spinal, left lateral recess, and left foraminal stenosis. 3. Similar to more pronounced upper lumbar disc degeneration, resulting in moderate multifactorial spinal stenosis at L2-L3, and mild right lateral recess stenosis at L1-L2.   Electronically Signed By: Lars Pinks M.D. On: 02/20/2013 16:38      Clinical History: XR right Ribs, 4 View. 06/2016  CLINICAL HISTORYrib pain,  COMPARISON: CR\SR- XR CHEST 2 VWS- 02/17/2014 04:35 PM EST   FINDINGS:  LUNGS: No focal airspace consolidation.There is persistent elevation of the right hemidiaphragm, this is unchanged compared to prior imaging.  PLEURAL SPACES: No pleural effusion. No pneumothorax.   BONES: No visible acute rib fracture.   LIMITED ABDOMEN: Pigtail catheter projects over the upper abdomen.Surgical clips are noted within the right upper quadrant of the abdomen.Lumbar spinal fusion hardware is partially visualized.  IMPRESSION:  No visible acute rib fracture. No pneumothorax.Stable elevation of the right hemidiaphragm -------- Lumbar MRI 05/21/2014  CLINICAL HISTORY: Right Sided numbness  with out sciatica, numbness and tingling.   TECHNIQUE:An MRI examination of the lumbar spine was performed utilizing multiplanar and multi sequential imaging with and without contrast. 8 cc of gadavist was administered.  COMPARISON STUDY:None  FINDINGS:   There has been a posterior fusion from L4-S1. There are interbody disc spacers present at L4-L5 and L5-S1. Chronic loss of height of T11.  At L5-S1 is been a right hemilaminectomy. There is no central spinal canal stenosis. The neural foramina are not stenotic. There is no abnormal enhancing granulation tissue at this disc space level. There is a 2.7 x 1.1 cm nonenhancing fluid collection posterior and lateral to the thecal sac which may represent a small seroma.  At L4-L5 there has been a wide laminectomy. There is no central spinal canal stenosis. The neural foramina are not stenotic.  At L3-L4 there is a moderate concentric disc bulge. There is bilateral facet hypertrophy and ligamentum flavum thickening. There is moderate central spinal canal stenosis. There is moderate right foraminal stenosis and mild to moderate left foraminal  stenosis  At L2-L3 there is a moderate disc bulge. There is bilateral facet hypertrophy and ligamentum flavum thickening. There is moderate central spinal canal stenosis. There is severe right foraminal stenosis. There is moderate to severe left foraminal stenosis.  At L1-L2 there is retrolisthesis. There is a right paracentral disc protrusion. The central spinal canal is mildly stenotic. There is bilateral moderate to severe foraminal stenosis. There is intervertebral disc space narrowing.  At T12-L1 there is no disc herniation, spinal canal or foraminal stenosis.  The conus medullaris ends at the level of L1. -------------  MRI LUMBAR SPINE WITHOUT AND WITH CONTRAST  TECHNIQUE: Multiplanar and multiecho pulse sequences of the lumbar spine were obtained without and with intravenous  contrast.  CONTRAST: 20mL MULTIHANCE GADOBENATE DIMEGLUMINE 529 MG/ML IV SOLN  COMPARISON: [facility] lumbar radiographs 02/05/2013. Intraoperative films 01/17/2007.  FINDINGS: Same numbering system used as in 2008, designating the fused levels L4-L5 and L5-S1. Sequelae of posterior and interbody fusion at those levels with mild susceptibility artifact. Postoperative changes to the overlying posterior paraspinal soft tissues. Mildly exaggerated upper lumbar lordosis. No marrow edema or evidence of acute osseous abnormality.  Visualized lower thoracic spinal cord is normal with conus medularis at L1-L2. No abnormal enhancement identified.  Negative visualized abdominal viscera.  T10-T11: Mild facet hypertrophy.  T11-T12: Anterior disc osteophyte complex. No significant stenosis.  T12-L1: Negative.  L1-L2: Disc bulge with superimposed right paracentral cephalad disc extrusion. Associated mild right lateral recess stenosis. No definite involvement of the right L1 foramen. No significant spinal stenosis at this level.  L2-L3: Broad-based disc protrusion with moderate facet and ligament flavum hypertrophy. Moderate spinal stenosis. Mild bilateral L2 foraminal stenosis.  L3-L4: Disc bulge with superimposed left paracentral cephalad disc extrusion (series 6, image 7 and series 4, image 15). Superimposed moderate to severe facet and ligament flavum hypertrophy. Overall mild spinal stenosis, mild left lateral recess stenosis, and mild left L3 foraminal stenosis.  L4-L5: Status post fusion and decompression. No stenosis.  L5-S1: Status post fusion and decompression. Small simple appearing fluid collection in the laminectomy space with no mass effect on the thecal sac. No stenosis.  IMPRESSION: 1. Sequelae of L4-L5 and L5-S1 fusion and decompression with no adverse features. 2. Adjacent segment disease at L3-L4 resulting in multifactorial mild spinal,  left lateral recess, and left foraminal stenosis. 3. Similar to more pronounced upper lumbar disc degeneration, resulting in moderate multifactorial spinal stenosis at L2-L3, and mild right lateral recess stenosis at L1-L2.   Electronically Signed By: Lars Pinks M.D. On: 02/20/2013 16:38  She reports that  has never smoked. she has never used smokeless tobacco. No results for input(s): HGBA1C, LABURIC in the last 8760 hours.  Objective:  VS:  HT:    WT:   BMI:     BP:125/87  HR:87bpm  TEMP:98 F (36.7 C)(Oral)  RESP:98 % Physical Exam  Constitutional: She is oriented to person, place, and time. She appears well-developed and well-nourished. No distress.  HENT:  Head: Normocephalic and atraumatic.  Nose: Nose normal.  Mouth/Throat: Oropharynx is clear and moist.  Eyes: Conjunctivae are normal. Pupils are equal, round, and reactive to light.  Neck: Neck supple. No tracheal deviation present.  Cardiovascular: Regular rhythm and intact distal pulses.  Pulmonary/Chest: Effort normal. No respiratory distress.  Abdominal: She exhibits no distension. There is no guarding.  Musculoskeletal:  Patient is very slow to rise from a seated position and ambulates with a cane.  She is very stiff the lumbar spine with  pain with extension rotation.  She has tenderness to palpation along all the musculature of the lower back even with light palpation and positive tender points.  Positive tenderness over the trochanters and PSIS.  She has no pain with hip rotation and she has good distal strength without clonus.  Neurological: She is alert and oriented to person, place, and time. She exhibits normal muscle tone. Coordination normal.  Skin: Skin is warm. No rash noted. No erythema.  Psychiatric: She has a normal mood and affect. Her behavior is normal.  Nursing note and vitals reviewed.   Ortho Exam Imaging: No results found.  Past Medical/Family/Surgical/Social History: Medications & Allergies  reviewed per EMR Patient Active Problem List   Diagnosis Date Noted  . Chronic right-sided thoracic back pain 12/21/2016  . Right-sided low back pain without sciatica 12/21/2016  . 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 Chronic pain syndrome 12/21/2016  . Calculus, ureter 12/21/2016  . Obesity 12/21/2016  . Abdominal aortic aneurysm ([place]) 07/04/2016  . Chronic pancreatitis ([place]) 07/04/2016  . Dizziness 07/04/2016  . 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 Essential hypertension 07/04/2016  . Gastroesophageal reflux disease 07/04/2016  . Osteoarthritis 07/04/2016  . History of abdominal aortic aneurysm (AAA) 06/30/2016  . Dry eyes 02/10/2016  . Nuclear sclerosis 02/10/2016  . Type 2 diabetes mellitus without complication ([place]) 65/78/4696  . Abdominal wall hernia 11/19/2015  . History of congestive heart failure 08/18/2015  . 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 Encounter for pancreatic duct stent exchange 08/10/2015  . Fatty liver 08/10/2015  . Aneurysm of thoracic aorta ([place]) 07/20/2015  . DDD (degenerative disc disease), lumbar 07/20/2015  . Hyperlipidemia 12/31/2014  . HTN (hypertension), benign 10/10/2014  . Diabetes mellitus type II, controlled, with no complications ([place]) 29/52/8413  . History of coronary artery disease 12/25/2013  . White coat hypertension 12/25/2013  . GERD (gastroesophageal reflux disease) 10/08/2012  . Nonischemic cardiomyopathy ([place]) 08/30/2011  . Pancreatic calcification 03/31/2011  . Angina pectoris ([place]) 02/24/2011  . Osteopenia 12/26/2008  . Migraine headache 04/25/2008  . Fibromyalgia 10/06/2006  . Depression 06/27/2006   Past Medical History:  Diagnosis Date  . Arthritis   . Diabetes mellitus without complication ([place])   . Heart disease   . Osteoarthritis   . Osteoporosis    History reviewed. No pertinent family history. Past Surgical History:  Procedure Laterality Date  . SPINAL FUSION     L4 to the sacrum fusion by Dr.  Sherwood Gambler in 2008   Social History   Occupational History  . Not on file  Tobacco Use  . Smoking status: Never Smoker  . Smokeless tobacco: Never Used  Substance and Sexual Activity  . Alcohol use: No  . Drug use: Not on file  . Sexual activity: Not on file

## 2017-05-16 ENCOUNTER — Telehealth (INDEPENDENT_AMBULATORY_CARE_PROVIDER_SITE_OTHER): Payer: Self-pay | Admitting: Physical Medicine and Rehabilitation

## 2017-05-16 NOTE — Telephone Encounter (Signed)
ok 

## 2017-05-17 NOTE — Telephone Encounter (Signed)
Left message #1

## 2017-05-18 NOTE — Telephone Encounter (Signed)
Patient reports that it is the higher levels that are hurting again. Scheduled for 4/4 at 1545.

## 2017-05-30 DIAGNOSIS — C4492 Squamous cell carcinoma of skin, unspecified: Secondary | ICD-10-CM

## 2017-05-30 HISTORY — DX: Squamous cell carcinoma of skin, unspecified: C44.92

## 2017-06-08 ENCOUNTER — Ambulatory Visit (INDEPENDENT_AMBULATORY_CARE_PROVIDER_SITE_OTHER): Payer: Self-pay

## 2017-06-08 ENCOUNTER — Ambulatory Visit (INDEPENDENT_AMBULATORY_CARE_PROVIDER_SITE_OTHER): Payer: Medicare Other | Admitting: Physical Medicine and Rehabilitation

## 2017-06-08 ENCOUNTER — Encounter (INDEPENDENT_AMBULATORY_CARE_PROVIDER_SITE_OTHER): Payer: Self-pay | Admitting: Physical Medicine and Rehabilitation

## 2017-06-08 VITALS — BP 151/93 | HR 65 | Temp 98.0°F

## 2017-06-08 DIAGNOSIS — M7061 Trochanteric bursitis, right hip: Secondary | ICD-10-CM | POA: Diagnosis not present

## 2017-06-08 DIAGNOSIS — M180 Bilateral primary osteoarthritis of first carpometacarpal joints: Secondary | ICD-10-CM

## 2017-06-08 DIAGNOSIS — M546 Pain in thoracic spine: Secondary | ICD-10-CM

## 2017-06-08 DIAGNOSIS — M7062 Trochanteric bursitis, left hip: Secondary | ICD-10-CM | POA: Diagnosis not present

## 2017-06-08 DIAGNOSIS — G8929 Other chronic pain: Secondary | ICD-10-CM

## 2017-06-08 DIAGNOSIS — M797 Fibromyalgia: Secondary | ICD-10-CM | POA: Diagnosis not present

## 2017-06-08 NOTE — Patient Instructions (Signed)

## 2017-06-08 NOTE — Progress Notes (Signed)
Zoe Fleming - 77 y.o. female MRN [id number]  Date of birth: 07/14/1940  Office Visit Note: Visit Date: 06/08/2017 PCP: Patient, No Pcp Per Referred by: No ref. provider found  Subjective: Chief Complaint  Patient presents with  . Lower Back - Pain  . Right Leg - Pain  . Left Leg - Pain   HPI: Zoe Fleming is a 77 year old female with complicated chronic pain history used to be in chronic pain management and lives in [place] that comes here for some of her care more recently.  We completed lower thoracic facet joint blocks which gave her a lot of relief of her mid back pain.  She does have spondylosis here on imaging and clearly has some level of myofascial pain and underlying fibromyalgia.  She comes in today with similar complaints and that is what we thought we are going to see her for she also reports really worsening bilateral lateral hip pain.  She states that she really cannot lay on either side without a lot of pain.  She is present with her daughter who provides some of the history.  She has had no new trauma or falls.  She has had no bowel or bladder changes or focal weakness.  She does endorse some new lower back type pain.  She reports that getting up from a seated position is also problematic but laying down can make it better.  Average pain rating is a 7.  It does seem to limit her daily activities.  By way of review she has a lumbar fusion at L4-5 and L5-S1 with adjacent level moderate stenosis at L3-4 and L2-3 and facet arthropathy.  Last MRI was in 2016.  She also asked today about bilateral hand pain particularly around the thumbs.  Examination of the hands does show extensive arthritis particularly CMC joint.  She believes she has seen Dr. Amedeo Plenty in the past.  This is not a new issue but recently worsened which she feels like it swollen.  She has had no new trauma.  She has had no numbness or tingling or night pain.   Review of Systems  Constitutional: Negative for  chills, fever, malaise/fatigue and weight loss.  HENT: Negative for hearing loss and sinus pain.   Eyes: Negative for blurred vision, double vision and photophobia.  Respiratory: Negative for cough and shortness of breath.   Cardiovascular: Negative for chest pain, palpitations and leg swelling.  Gastrointestinal: Negative for abdominal pain, nausea and vomiting.  Genitourinary: Negative for flank pain.  Musculoskeletal: Positive for back pain and joint pain. Negative for myalgias.  Skin: Negative for itching and rash.  Neurological: Positive for tingling. Negative for tremors, focal weakness and weakness.  Endo/Heme/Allergies: Negative.   Psychiatric/Behavioral: Negative for depression.  All other systems reviewed and are negative.  Otherwise per HPI.  Assessment & Plan: Visit Diagnoses:  1. Greater trochanteric bursitis, left   2. Greater trochanteric bursitis, right   3. Primary osteoarthritis of both first carpometacarpal joints   4. Chronic right-sided thoracic back pain   5. Fibromyalgia     Plan: Findings:  Diagnosis is chronic pain syndrome and underlying fibromyalgia and prior lumbar fusion with really multifactorial pain issues.  Today however her biggest complaint is the bilateral pain over the greater trochanters and on exam she has pretty exquisite pain over the greater trochanters.  This could be flavored with the fibromyalgia and may be tender points but is pretty exquisite to the touch.  She has no groin pain  for rotation and she does have some pain with facet joint loading.  I think today the best approach is bilateral greater trochanteric injections with fluoroscopic guidance to the patient's body habitus.  Depending on relief would have her come back and possibly look at injection above her fusion whether that be an epidural or facet joint block.  Her upper back pain is difficult she thinks this is more thoracic and we have injected the lower thoracic area but she is fairly  short and I think the distance between L2 in the lower thoracic is not very far.  Regardless today we did complete bilateral greater trochanteric injections and we will see her back as needed.  She will continue with current medications.  She is also aware that we are not chronic pain management cannot really monitor any opioid type management.  In terms of her hand pain I do think she has Strong joint arthritis as well as other joint arthritis.  I have asked her to follow-up with hand surgeon that she saw in the past which I think was Dr. Amedeo Plenty.    Meds & Orders: No orders of the defined types were placed in this encounter.   Orders Placed This Encounter  Procedures  . Large Joint Inj: bilateral greater trochanter  . XR C-ARM NO REPORT    Follow-up: Return if symptoms worsen or fail to improve.   Procedures: Large Joint Inj: bilateral greater trochanter on 06/08/2017 4:30 PM Indications: pain and diagnostic evaluation Details: 22 G 3.5 in needle, fluoroscopy-guided lateral approach  Arthrogram: No  Medications (Right): 3 mL bupivacaine 0.5 %; 60 mg triamcinolone acetonide 40 MG/ML Medications (Left): 3 mL bupivacaine 0.5 %; 60 mg triamcinolone acetonide 40 MG/ML Outcome: tolerated well, no immediate complications  There was excellent flow of contrast outlined the greater trochanteric bursa without vascular uptake. Procedure, treatment alternatives, risks and benefits explained, specific risks discussed. Consent was given by the patient. Immediately prior to procedure a time out was called to verify the correct patient, procedure, equipment, support staff and site/side marked as required. Patient was prepped and draped in the usual sterile fashion.      No notes on file   Clinical History: XR right Ribs, 4 View. 06/2016  CLINICAL HISTORYrib pain,  COMPARISON: CR\SR- XR CHEST 2 VWS- 02/17/2014 04:35 PM EST   FINDINGS:  LUNGS: No focal airspace consolidation.There is  persistent elevation of the right hemidiaphragm, this is unchanged compared to prior imaging.  PLEURAL SPACES: No pleural effusion. No pneumothorax.   BONES: No visible acute rib fracture.   LIMITED ABDOMEN: Pigtail catheter projects over the upper abdomen.Surgical clips are noted within the right upper quadrant of the abdomen.Lumbar spinal fusion hardware is partially visualized.  IMPRESSION:  No visible acute rib fracture. No pneumothorax.Stable elevation of the right hemidiaphragm -------- Lumbar MRI 05/21/2014  CLINICAL HISTORY: Right Sided numbness with out sciatica, numbness and tingling.   TECHNIQUE:An MRI examination of the lumbar spine was performed utilizing multiplanar and multi sequential imaging with and without contrast. 8 cc of gadavist was administered.  COMPARISON STUDY:None  FINDINGS:   There has been a posterior fusion from L4-S1. There are interbody disc spacers present at L4-L5 and L5-S1. Chronic loss of height of T11.  At L5-S1 is been a right hemilaminectomy. There is no central spinal canal stenosis. The neural foramina are not stenotic. There is no abnormal enhancing granulation tissue at this disc space level. There is a 2.7 x 1.1 cm nonenhancing fluid collection posterior  and lateral to the thecal sac which may represent a small seroma.  At L4-L5 there has been a wide laminectomy. There is no central spinal canal stenosis. The neural foramina are not stenotic.  At L3-L4 there is a moderate concentric disc bulge. There is bilateral facet hypertrophy and ligamentum flavum thickening. There is moderate central spinal canal stenosis. There is moderate right foraminal stenosis and mild to moderate left foraminal stenosis  At L2-L3 there is a moderate disc bulge. There is bilateral facet hypertrophy and ligamentum flavum thickening. There is moderate central spinal canal stenosis. There is severe right foraminal stenosis. There is moderate to severe left  foraminal stenosis.  At L1-L2 there is retrolisthesis. There is a right paracentral disc protrusion. The central spinal canal is mildly stenotic. There is bilateral moderate to severe foraminal stenosis. There is intervertebral disc space narrowing.  At T12-L1 there is no disc herniation, spinal canal or foraminal stenosis.  The conus medullaris ends at the level of L1. -------------  MRI LUMBAR SPINE WITHOUT AND WITH CONTRAST  TECHNIQUE: Multiplanar and multiecho pulse sequences of the lumbar spine were obtained without and with intravenous contrast.  CONTRAST: 80mL MULTIHANCE GADOBENATE DIMEGLUMINE 529 MG/ML IV SOLN  COMPARISON: [facility] lumbar radiographs 02/05/2013. Intraoperative films 01/17/2007.  FINDINGS: Same numbering system used as in 2008, designating the fused levels L4-L5 and L5-S1. Sequelae of posterior and interbody fusion at those levels with mild susceptibility artifact. Postoperative changes to the overlying posterior paraspinal soft tissues. Mildly exaggerated upper lumbar lordosis. No marrow edema or evidence of acute osseous abnormality.  Visualized lower thoracic spinal cord is normal with conus medularis at L1-L2. No abnormal enhancement identified.  Negative visualized abdominal viscera.  T10-T11: Mild facet hypertrophy.  T11-T12: Anterior disc osteophyte complex. No significant stenosis.  T12-L1: Negative.  L1-L2: Disc bulge with superimposed right paracentral cephalad disc extrusion. Associated mild right lateral recess stenosis. No definite involvement of the right L1 foramen. No significant spinal stenosis at this level.  L2-L3: Broad-based disc protrusion with moderate facet and ligament flavum hypertrophy. Moderate spinal stenosis. Mild bilateral L2 foraminal stenosis.  L3-L4: Disc bulge with superimposed left paracentral cephalad disc extrusion (series 6, image 7 and series 4, image 15). Superimposed moderate  to severe facet and ligament flavum hypertrophy. Overall mild spinal stenosis, mild left lateral recess stenosis, and mild left L3 foraminal stenosis.  L4-L5: Status post fusion and decompression. No stenosis.  L5-S1: Status post fusion and decompression. Small simple appearing fluid collection in the laminectomy space with no mass effect on the thecal sac. No stenosis.  IMPRESSION: 1. Sequelae of L4-L5 and L5-S1 fusion and decompression with no adverse features. 2. Adjacent segment disease at L3-L4 resulting in multifactorial mild spinal, left lateral recess, and left foraminal stenosis. 3. Similar to more pronounced upper lumbar disc degeneration, resulting in moderate multifactorial spinal stenosis at L2-L3, and mild right lateral recess stenosis at L1-L2.   Electronically Signed By: Lars Pinks M.D. On: 02/20/2013 16:38   She reports that she has never smoked. She has never used smokeless tobacco. No results for input(s): HGBA1C, LABURIC in the last 8760 hours.  Objective:  VS:  HT:    WT:   BMI:     BP:(!) 151/93  HR:65bpm  TEMP:98 F (36.7 C)(Oral)  RESP:97 % Physical Exam  Constitutional: She is oriented to person, place, and time. She appears well-developed and well-nourished. No distress.  HENT:  Head: Normocephalic and atraumatic.  Nose: Nose normal.  Mouth/Throat: Oropharynx is clear and  moist.  Eyes: Pupils are equal, round, and reactive to light. Conjunctivae are normal.  Neck: Normal range of motion. Neck supple.  Cardiovascular: Regular rhythm and intact distal pulses.  Pulmonary/Chest: Effort normal. No respiratory distress.  Abdominal: She exhibits no distension. There is no guarding.  Musculoskeletal:  Patient is slow to rise from a seated position.  She has concordant low back pain with extension rotation and facet joint loading.  She has pain over the greater trochanters which is fairly exquisite pain and reproduces her hip pain.  She has no pain  with hip rotation.  She has good distal strength without clonus.  She does stand with a forward flexed spine and is fairly stiff in the lower spine.  Neurological: She is alert and oriented to person, place, and time. She exhibits normal muscle tone. Coordination normal.  Skin: Skin is warm. No rash noted. No erythema.  Psychiatric: She has a normal mood and affect. Her behavior is normal.  Nursing note and vitals reviewed.   Ortho Exam Imaging: No results found.  Past Medical/Family/Surgical/Social History: Medications & Allergies reviewed per EMR, new medications updated. Patient Active Problem List   Diagnosis Date Noted  . Chronic right-sided thoracic back pain 12/21/2016  . Right-sided low back pain without sciatica 12/21/2016  . 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 Chronic pain syndrome 12/21/2016  . Calculus, ureter 12/21/2016  . Obesity 12/21/2016  . Abdominal aortic aneurysm ([place]) 07/04/2016  . Chronic pancreatitis ([place]) 07/04/2016  . Dizziness 07/04/2016  . 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 Essential hypertension 07/04/2016  . Gastroesophageal reflux disease 07/04/2016  . Osteoarthritis 07/04/2016  . History of abdominal aortic aneurysm (AAA) 06/30/2016  . Dry eyes 02/10/2016  . Nuclear sclerosis 02/10/2016  . Type 2 diabetes mellitus without complication ([place]) 44/05/4740  . Abdominal wall hernia 11/19/2015  . History of congestive heart failure 08/18/2015  . 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 Encounter for pancreatic duct stent exchange 08/10/2015  . Fatty liver 08/10/2015  . Aneurysm of thoracic aorta ([place]) 07/20/2015  . DDD (degenerative disc disease), lumbar 07/20/2015  . Hyperlipidemia 12/31/2014  . HTN (hypertension), benign 10/10/2014  . Diabetes mellitus type II, controlled, with no complications ([place]) 59/56/3875  . History of coronary artery disease 12/25/2013  . White coat hypertension 12/25/2013  . GERD (gastroesophageal reflux disease)  10/08/2012  . Nonischemic cardiomyopathy ([place]) 08/30/2011  . Pancreatic calcification 03/31/2011  . Angina pectoris ([place]) 02/24/2011  . Osteopenia 12/26/2008  . Migraine headache 04/25/2008  . Fibromyalgia 10/06/2006  . Depression 06/27/2006   Past Medical History:  Diagnosis Date  . Arthritis   . Diabetes mellitus without complication ([place])   . Heart disease   . Osteoarthritis   . Osteoporosis    History reviewed. No pertinent family history. Past Surgical History:  Procedure Laterality Date  . SPINAL FUSION     L4 to the sacrum fusion by Dr. Sherwood Gambler in 2008   Social History   Occupational History  . Not on file  Tobacco Use  . Smoking status: Never Smoker  . Smokeless tobacco: Never Used  Substance and Sexual Activity  . Alcohol use: No  . Drug use: Not on file  . Sexual activity: Not on file

## 2017-06-08 NOTE — Progress Notes (Signed)
 .  Numeric Pain Rating Scale and Functional Assessment Average Pain 7   In the last MONTH (on 0-10 scale) has pain interfered with the following?  1. General activity like being  able to carry out your everyday physical activities such as walking, climbing stairs, carrying groceries, or moving a chair?  Rating(5)   +Driver, -BT, -Dye Allergies.  

## 2017-06-15 ENCOUNTER — Encounter (INDEPENDENT_AMBULATORY_CARE_PROVIDER_SITE_OTHER): Payer: Self-pay | Admitting: Physical Medicine and Rehabilitation

## 2017-06-15 MED ORDER — BUPIVACAINE HCL 0.5 % IJ SOLN
3.0000 mL | INTRAMUSCULAR | Status: AC | PRN
  Administered 2017-06-08: 3 mL via INTRA_ARTICULAR

## 2017-06-15 MED ORDER — TRIAMCINOLONE ACETONIDE 40 MG/ML IJ SUSP
60.0000 mg | INTRAMUSCULAR | Status: AC | PRN
  Administered 2017-06-08: 60 mg via INTRA_ARTICULAR

## 2017-11-09 ENCOUNTER — Telehealth (INDEPENDENT_AMBULATORY_CARE_PROVIDER_SITE_OTHER): Payer: Self-pay | Admitting: Physical Medicine and Rehabilitation

## 2017-11-09 NOTE — Telephone Encounter (Signed)
Ok to repeat, significant fibromyalgia and lives in [place]

## 2017-11-09 NOTE — Telephone Encounter (Signed)
Scheduled for 11/21/17 at 1500 with driver.

## 2017-11-21 ENCOUNTER — Ambulatory Visit (INDEPENDENT_AMBULATORY_CARE_PROVIDER_SITE_OTHER): Payer: Self-pay

## 2017-11-21 ENCOUNTER — Ambulatory Visit (INDEPENDENT_AMBULATORY_CARE_PROVIDER_SITE_OTHER): Payer: Medicare Other | Admitting: Physical Medicine and Rehabilitation

## 2017-11-21 ENCOUNTER — Encounter (INDEPENDENT_AMBULATORY_CARE_PROVIDER_SITE_OTHER): Payer: Self-pay | Admitting: Physical Medicine and Rehabilitation

## 2017-11-21 VITALS — BP 157/95 | HR 78

## 2017-11-21 DIAGNOSIS — M461 Sacroiliitis, not elsewhere classified: Secondary | ICD-10-CM

## 2017-11-21 DIAGNOSIS — G894 Chronic pain syndrome: Secondary | ICD-10-CM

## 2017-11-21 DIAGNOSIS — M7061 Trochanteric bursitis, right hip: Secondary | ICD-10-CM

## 2017-11-21 DIAGNOSIS — M961 Postlaminectomy syndrome, not elsewhere classified: Secondary | ICD-10-CM | POA: Diagnosis not present

## 2017-11-21 NOTE — Progress Notes (Signed)
 .  Numeric Pain Rating Scale and Functional Assessment Average Pain 8   In the last MONTH (on 0-10 scale) has pain interfered with the following?  1. General activity like being  able to carry out your everyday physical activities such as walking, climbing stairs, carrying groceries, or moving a chair?  Rating(6)   +Driver, -BT, -Dye Allergies.  

## 2017-11-21 NOTE — Progress Notes (Signed)
Zoe Fleming - 77 y.o. female MRN [id number]  Date of birth: 1940/12/22  Office Visit Note: Visit Date: 11/21/2017 PCP: Patient, No Pcp Per Referred by: No ref. provider found  Subjective: Chief Complaint  Patient presents with  . Lower Back - Pain   HPI: Zoe Fleming is a 77 year old female that we have seen on a few occasions he drives from [place] down to our city for medical care quite frequently.  She has multiple medical problems including diabetes with complications as well as heart disease and vascular disease and fibromyalgia.  She comes in today with continued chronic back pain and chronic pain syndrome.  She is status post lower lumbar fusion to the sacrum.  She is failed conservative and medication management care for some years.  Injections above her fusion which were facet joint blocks have been beneficial but she reports today pain really in the lower back and right buttock region.  Pain is really over the sacroiliac joint.  No pain over the greater trochanter but she does have tender points in multiple places.  No focal weakness or new trauma.  Pain is worse with going from sit to stand and prolonged sitting.  Symptoms better with walking although she gets a lot of back pain with walking in general.  Overall medications are status quo.   Review of Systems  Constitutional: Positive for malaise/fatigue. Negative for chills, fever and weight loss.  HENT: Negative for hearing loss and sinus pain.   Eyes: Negative for blurred vision, double vision and photophobia.  Respiratory: Negative for cough and shortness of breath.   Cardiovascular: Negative for chest pain, palpitations and leg swelling.  Gastrointestinal: Negative for abdominal pain, nausea and vomiting.  Genitourinary: Negative for flank pain.  Musculoskeletal: Positive for back pain and joint pain. Negative for myalgias.  Skin: Negative for itching and rash.  Neurological: Negative for tremors, focal weakness and  weakness.  Endo/Heme/Allergies: Negative.   Psychiatric/Behavioral: Negative for depression.  All other systems reviewed and are negative.  Otherwise per HPI.  Assessment & Plan: Visit Diagnoses:  1. Sacroiliitis, not elsewhere classified ([place])   2. Greater trochanteric bursitis, right   3. Post laminectomy syndrome   4. Chronic pain syndrome     Plan: Findings:  Chronic recalcitrant low back pain and hip pain with chronic pain syndrome status post lower lumbar fusion to the sacrum.  Failure of conservative and nonconservative care.  History of fibromyalgia.  Had some good relief with injection above the fusion.  More pain in the PSIS region.  We are going to complete a diagnostic therapeutic sacroiliac joint injection today.  This is dictated separately.  Medications unchanged.  Patient receives medication from primary care physician.    Meds & Orders: No orders of the defined types were placed in this encounter.   Orders Placed This Encounter  Procedures  . Sacroiliac Joint Inj  . XR C-ARM NO REPORT    Follow-up: Return if symptoms worsen or fail to improve.   Procedures: Sacroiliac Joint Inj on 11/21/2017 3:12 PM Indications: pain and diagnostic evaluation Details: 22 G 3.5 in needle, fluoroscopy-guided posterior approach Medications: 2 mL bupivacaine 0.5 %; 80 mg methylPREDNISolone acetate 80 MG/ML Outcome: tolerated well, no immediate complications  There was excellent flow of contrast producing a partial arthrogram of the sacroiliac joint.  Procedure, treatment alternatives, risks and benefits explained, specific risks discussed. Consent was given by the patient. Immediately prior to procedure a time out was called to verify the correct  patient, procedure, equipment, support staff and site/side marked as required. Patient was prepped and draped in the usual sterile fashion.      No notes on file   Clinical History: XR right Ribs, 4 View. 06/2016  CLINICAL HISTORYrib  pain,  COMPARISON: CR\SR- XR CHEST 2 VWS- 02/17/2014 04:35 PM EST   FINDINGS:  LUNGS: No focal airspace consolidation.There is persistent elevation of the right hemidiaphragm, this is unchanged compared to prior imaging.  PLEURAL SPACES: No pleural effusion. No pneumothorax.   BONES: No visible acute rib fracture.   LIMITED ABDOMEN: Pigtail catheter projects over the upper abdomen.Surgical clips are noted within the right upper quadrant of the abdomen.Lumbar spinal fusion hardware is partially visualized.  IMPRESSION:  No visible acute rib fracture. No pneumothorax.Stable elevation of the right hemidiaphragm -------- Lumbar MRI 05/21/2014  CLINICAL HISTORY: Right Sided numbness with out sciatica, numbness and tingling.   TECHNIQUE:An MRI examination of the lumbar spine was performed utilizing multiplanar and multi sequential imaging with and without contrast. 8 cc of gadavist was administered.  COMPARISON STUDY:None  FINDINGS:   There has been a posterior fusion from L4-S1. There are interbody disc spacers present at L4-L5 and L5-S1. Chronic loss of height of T11.  At L5-S1 is been a right hemilaminectomy. There is no central spinal canal stenosis. The neural foramina are not stenotic. There is no abnormal enhancing granulation tissue at this disc space level. There is a 2.7 x 1.1 cm nonenhancing fluid collection posterior and lateral to the thecal sac which may represent a small seroma.  At L4-L5 there has been a wide laminectomy. There is no central spinal canal stenosis. The neural foramina are not stenotic.  At L3-L4 there is a moderate concentric disc bulge. There is bilateral facet hypertrophy and ligamentum flavum thickening. There is moderate central spinal canal stenosis. There is moderate right foraminal stenosis and mild to moderate left foraminal stenosis  At L2-L3 there is a moderate disc bulge. There is bilateral facet hypertrophy and ligamentum  flavum thickening. There is moderate central spinal canal stenosis. There is severe right foraminal stenosis. There is moderate to severe left foraminal stenosis.  At L1-L2 there is retrolisthesis. There is a right paracentral disc protrusion. The central spinal canal is mildly stenotic. There is bilateral moderate to severe foraminal stenosis. There is intervertebral disc space narrowing.  At T12-L1 there is no disc herniation, spinal canal or foraminal stenosis.  The conus medullaris ends at the level of L1. -------------  MRI LUMBAR SPINE WITHOUT AND WITH CONTRAST  TECHNIQUE: Multiplanar and multiecho pulse sequences of the lumbar spine were obtained without and with intravenous contrast.  CONTRAST: 4mL MULTIHANCE GADOBENATE DIMEGLUMINE 529 MG/ML IV SOLN  COMPARISON: [facility] lumbar radiographs 02/05/2013. Intraoperative films 01/17/2007.  FINDINGS: Same numbering system used as in 2008, designating the fused levels L4-L5 and L5-S1. Sequelae of posterior and interbody fusion at those levels with mild susceptibility artifact. Postoperative changes to the overlying posterior paraspinal soft tissues. Mildly exaggerated upper lumbar lordosis. No marrow edema or evidence of acute osseous abnormality.  Visualized lower thoracic spinal cord is normal with conus medularis at L1-L2. No abnormal enhancement identified.  Negative visualized abdominal viscera.  T10-T11: Mild facet hypertrophy.  T11-T12: Anterior disc osteophyte complex. No significant stenosis.  T12-L1: Negative.  L1-L2: Disc bulge with superimposed right paracentral cephalad disc extrusion. Associated mild right lateral recess stenosis. No definite involvement of the right L1 foramen. No significant spinal stenosis at this level.  L2-L3: Broad-based disc protrusion  with moderate facet and ligament flavum hypertrophy. Moderate spinal stenosis. Mild bilateral L2 foraminal  stenosis.  L3-L4: Disc bulge with superimposed left paracentral cephalad disc extrusion (series 6, image 7 and series 4, image 15). Superimposed moderate to severe facet and ligament flavum hypertrophy. Overall mild spinal stenosis, mild left lateral recess stenosis, and mild left L3 foraminal stenosis.  L4-L5: Status post fusion and decompression. No stenosis.  L5-S1: Status post fusion and decompression. Small simple appearing fluid collection in the laminectomy space with no mass effect on the thecal sac. No stenosis.  IMPRESSION: 1. Sequelae of L4-L5 and L5-S1 fusion and decompression with no adverse features. 2. Adjacent segment disease at L3-L4 resulting in multifactorial mild spinal, left lateral recess, and left foraminal stenosis. 3. Similar to more pronounced upper lumbar disc degeneration, resulting in moderate multifactorial spinal stenosis at L2-L3, and mild right lateral recess stenosis at L1-L2.   Electronically Signed By: Lars Pinks M.D. On: 02/20/2013 16:38   She reports that she has never smoked. She has never used smokeless tobacco. No results for input(s): HGBA1C, LABURIC in the last 8760 hours.  Objective:  VS:  HT:    WT:   BMI:     BP:(!) 157/95  HR:78bpm  TEMP: ( )  RESP:  Physical Exam  Constitutional: She is oriented to person, place, and time. She appears well-developed and well-nourished.  Eyes: Pupils are equal, round, and reactive to light. Conjunctivae and EOM are normal.  Neck: Normal range of motion. Neck supple.  Cardiovascular: Normal rate and intact distal pulses.  Pulmonary/Chest: Effort normal.  Abdominal: Soft.  Musculoskeletal:  Patient ambulates with a wide-based gait.  She has pain going from sit to stand in full extension and facet joint loading.  She is very stiff lumbar spine with tender points across the lumbar spine and PSIS and greater trochanters.  Some exquisite pain on the right greater trochanter more than left.  No  pain with hip rotation internally.  She does have an equivocal Patrick's test on the right.  Good distal strength.  Neurological: She is alert and oriented to person, place, and time. She exhibits normal muscle tone.  Skin: Skin is warm and dry. No rash noted. No erythema.  Psychiatric: She has a normal mood and affect. Her behavior is normal.  Nursing note and vitals reviewed.   Ortho Exam Imaging: No results found.  Past Medical/Family/Surgical/Social History: Medications & Allergies reviewed per EMR, new medications updated. Patient Active Problem List   Diagnosis Date Noted  . Chronic right-sided thoracic back pain 12/21/2016  . Right-sided low back pain without sciatica 12/21/2016  . 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 Chronic pain syndrome 12/21/2016  . Calculus, ureter 12/21/2016  . Obesity 12/21/2016  . Abdominal aortic aneurysm ([place]) 07/04/2016  . Chronic pancreatitis ([place]) 07/04/2016  . Dizziness 07/04/2016  . 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 Essential hypertension 07/04/2016  . Gastroesophageal reflux disease 07/04/2016  . Osteoarthritis 07/04/2016  . History of abdominal aortic aneurysm (AAA) 06/30/2016  . Dry eyes 02/10/2016  . Nuclear sclerosis 02/10/2016  . Type 2 diabetes mellitus without complication ([place]) 27/25/3664  . Abdominal wall hernia 11/19/2015  . History of congestive heart failure 08/18/2015  . 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 Encounter for pancreatic duct stent exchange 08/10/2015  . Fatty liver 08/10/2015  . Aneurysm of thoracic aorta ([place]) 07/20/2015  . DDD (degenerative disc disease), lumbar 07/20/2015  . Hyperlipidemia 12/31/2014  . HTN (hypertension), benign 10/10/2014  . Diabetes mellitus type  II, controlled, with no complications ([place]) 33/54/5625  . History of coronary artery disease 12/25/2013  . White coat hypertension 12/25/2013  . GERD (gastroesophageal reflux disease) 10/08/2012  . Nonischemic cardiomyopathy  ([place]) 08/30/2011  . Pancreatic calcification 03/31/2011  . Angina pectoris ([place]) 02/24/2011  . Osteopenia 12/26/2008  . Migraine headache 04/25/2008  . Fibromyalgia 10/06/2006  . Depression 06/27/2006   Past Medical History:  Diagnosis Date  . Arthritis   . Diabetes mellitus without complication ([place])   . Heart disease   . Osteoarthritis   . Osteoporosis    History reviewed. No pertinent family history. Past Surgical History:  Procedure Laterality Date  . SPINAL FUSION     L4 to the sacrum fusion by Dr. Sherwood Gambler in 2008   Social History   Occupational History  . Not on file  Tobacco Use  . Smoking status: Never Smoker  . Smokeless tobacco: Never Used  Substance and Sexual Activity  . Alcohol use: No  . Drug use: Not on file  . Sexual activity: Not on file

## 2017-12-01 MED ORDER — METHYLPREDNISOLONE ACETATE 80 MG/ML IJ SUSP
80.0000 mg | INTRAMUSCULAR | Status: AC | PRN
  Administered 2017-11-21: 80 mg via INTRA_ARTICULAR

## 2017-12-01 MED ORDER — BUPIVACAINE HCL 0.5 % IJ SOLN
2.0000 mL | INTRAMUSCULAR | Status: AC | PRN
  Administered 2017-11-21: 2 mL via INTRA_ARTICULAR

## 2018-01-11 ENCOUNTER — Ambulatory Visit: Payer: Medicare Other | Admitting: Podiatry

## 2018-01-17 ENCOUNTER — Ambulatory Visit: Payer: Medicare Other | Admitting: Podiatry

## 2018-01-17 ENCOUNTER — Encounter

## 2018-03-28 ENCOUNTER — Telehealth (INDEPENDENT_AMBULATORY_CARE_PROVIDER_SITE_OTHER): Payer: Self-pay | Admitting: Physical Medicine and Rehabilitation

## 2018-03-28 NOTE — Telephone Encounter (Signed)
im ok with it as repeat

## 2018-03-29 NOTE — Telephone Encounter (Signed)
Scheduled for 2/10 with driver.

## 2018-03-29 NOTE — Telephone Encounter (Signed)
Can you call to schedule 

## 2018-04-16 ENCOUNTER — Encounter (INDEPENDENT_AMBULATORY_CARE_PROVIDER_SITE_OTHER): Payer: Self-pay | Admitting: Physical Medicine and Rehabilitation

## 2018-04-16 ENCOUNTER — Ambulatory Visit (INDEPENDENT_AMBULATORY_CARE_PROVIDER_SITE_OTHER): Payer: Medicare Other | Admitting: Physical Medicine and Rehabilitation

## 2018-04-16 ENCOUNTER — Ambulatory Visit (INDEPENDENT_AMBULATORY_CARE_PROVIDER_SITE_OTHER): Payer: Self-pay

## 2018-04-16 VITALS — BP 136/105 | HR 92 | Temp 97.9°F

## 2018-04-16 DIAGNOSIS — G8929 Other chronic pain: Secondary | ICD-10-CM | POA: Diagnosis not present

## 2018-04-16 DIAGNOSIS — M47816 Spondylosis without myelopathy or radiculopathy, lumbar region: Secondary | ICD-10-CM | POA: Diagnosis not present

## 2018-04-16 DIAGNOSIS — M545 Low back pain: Secondary | ICD-10-CM

## 2018-04-16 MED ORDER — METHYLPREDNISOLONE ACETATE 80 MG/ML IJ SUSP
80.0000 mg | Freq: Once | INTRAMUSCULAR | Status: AC
  Administered 2018-04-16: 80 mg

## 2018-04-16 NOTE — Progress Notes (Signed)
.     Numeric Pain Rating Scale and Functional Assessment Average Pain 10   In the last MONTH (on 0-10 scale) has pain interfered with the following?  1. General activity like being  able to carry out your everyday physical activities such as walking, climbing stairs, carrying groceries, or moving a chair?  Rating(7)   +Driver, -BT, -Dye Allergies.  

## 2018-04-16 NOTE — Procedures (Signed)
Lumbar Facet Joint Intra-Articular Injection(s) with Fluoroscopic Guidance  Patient: Zoe Fleming      Date of Birth: 1940-03-17 MRN: [id number] PCP: Patient, No Pcp Per      Visit Date: 04/16/2018   Universal Protocol:    Date/Time: 04/16/2018  Consent Given By: the patient  Position: PRONE   Additional Comments: Vital signs were monitored before and after the procedure. Patient was prepped and draped in the usual sterile fashion. The correct patient, procedure, and site was verified.   Injection Procedure Details:  Procedure Site One Meds Administered:  Meds ordered this encounter  Medications  . methylPREDNISolone acetate (DEPO-MEDROL) injection 80 mg     Laterality: Bilateral  Location/Site:  L4-L5  Needle size: 22 guage  Needle type: Spinal  Needle Placement: Articular  Findings:  -Comments: Excellent flow of contrast producing a partial arthrogram.  Procedure Details: The fluoroscope beam is vertically oriented in AP, and the inferior recess is visualized beneath the lower pole of the inferior apophyseal process, which represents the target point for needle insertion. When direct visualization is difficult the target point is located at the medial projection of the vertebral pedicle. The region overlying each aforementioned target is locally anesthetized with a 1 to 2 ml. volume of 1% Lidocaine without Epinephrine.   The spinal needle was inserted into each of the above mentioned facet joints using biplanar fluoroscopic guidance. A 0.25 to 0.5 ml. volume of Isovue-250 was injected and a partial facet joint arthrogram was obtained. A single spot film was obtained of the resulting arthrogram.    One to 1.25 ml of the steroid/anesthetic solution was then injected into each of the facet joints noted above.   Additional Comments:  The patient tolerated the procedure well Dressing: Band-Aid    Post-procedure details: Patient was observed during the  procedure. Post-procedure instructions were reviewed.  Patient left the clinic in stable condition.

## 2018-04-16 NOTE — Progress Notes (Signed)
Zoe Fleming - 78 y.o. female MRN [id number]  Date of birth: [birth year]  Office Visit Note: Visit Date: 04/16/2018 PCP: Patient, No Pcp Per Referred by: No ref. provider found  Subjective: Chief Complaint  Patient presents with  . Lower Back - Pain   HPI: Zoe Fleming is a 78 y.o. female who comes in today For repeat lumbar intra-articular facet joint block.  She has mainly facet joint arthritis and we completed intra-articular injection back in September with good relief up until just a few weeks ago.  I think this is a case where she is getting good therapeutic relief from an intra-articular joint injection.  Depending on relief could look at diagnostic medial branch blocks and radiofrequency ablation.  Again injection helped quite a bit up until just recently with no new trauma.  Pain is consistent with facet mediated pain worse with walking and standing better with laying down and rest.  Average pain is 10 out of 10.  No leg pain no focal weakness.  ROS Otherwise per HPI.  Assessment & Plan: Visit Diagnoses:  1. Spondylosis without myelopathy or radiculopathy, lumbar region   2. Chronic bilateral low back pain without sciatica     Plan: No additional findings.   Meds & Orders:  Meds ordered this encounter  Medications  . methylPREDNISolone acetate (DEPO-MEDROL) injection 80 mg    Orders Placed This Encounter  Procedures  . Facet Injection  . XR C-ARM NO REPORT    Follow-up: Return if symptoms worsen or fail to improve.   Procedures: No procedures performed  Lumbar Facet Joint Intra-Articular Injection(s) with Fluoroscopic Guidance  Patient: Zoe Fleming      Date of Birth: 07-05-1940 MRN: [id number] PCP: Patient, No Pcp Per      Visit Date: 04/16/2018   Universal Protocol:    Date/Time: 04/16/2018  Consent Given By: the patient  Position: PRONE   Additional Comments: Vital signs were monitored before and after the procedure. Patient was prepped and  draped in the usual sterile fashion. The correct patient, procedure, and site was verified.   Injection Procedure Details:  Procedure Site One Meds Administered:  Meds ordered this encounter  Medications  . methylPREDNISolone acetate (DEPO-MEDROL) injection 80 mg     Laterality: Bilateral  Location/Site:  L4-L5  Needle size: 22 guage  Needle type: Spinal  Needle Placement: Articular  Findings:  -Comments: Excellent flow of contrast producing a partial arthrogram.  Procedure Details: The fluoroscope beam is vertically oriented in AP, and the inferior recess is visualized beneath the lower pole of the inferior apophyseal process, which represents the target point for needle insertion. When direct visualization is difficult the target point is located at the medial projection of the vertebral pedicle. The region overlying each aforementioned target is locally anesthetized with a 1 to 2 ml. volume of 1% Lidocaine without Epinephrine.   The spinal needle was inserted into each of the above mentioned facet joints using biplanar fluoroscopic guidance. A 0.25 to 0.5 ml. volume of Isovue-250 was injected and a partial facet joint arthrogram was obtained. A single spot film was obtained of the resulting arthrogram.    One to 1.25 ml of the steroid/anesthetic solution was then injected into each of the facet joints noted above.   Additional Comments:  The patient tolerated the procedure well Dressing: Band-Aid    Post-procedure details: Patient was observed during the procedure. Post-procedure instructions were reviewed.  Patient left the clinic in stable condition.    Clinical History:  XR right Ribs, 4 View. 06/2016  CLINICAL HISTORYrib pain,  COMPARISON: CR\SR- XR CHEST 2 VWS- 02/17/2014 04:35 PM EST   FINDINGS:  LUNGS: No focal airspace consolidation.There is persistent elevation of the right hemidiaphragm, this is unchanged compared to prior imaging.  PLEURAL  SPACES: No pleural effusion. No pneumothorax.   BONES: No visible acute rib fracture.   LIMITED ABDOMEN: Pigtail catheter projects over the upper abdomen.Surgical clips are noted within the right upper quadrant of the abdomen.Lumbar spinal fusion hardware is partially visualized.  IMPRESSION:  No visible acute rib fracture. No pneumothorax.Stable elevation of the right hemidiaphragm -------- Lumbar MRI 05/21/2014  CLINICAL HISTORY: Right Sided numbness with out sciatica, numbness and tingling.   TECHNIQUE:An MRI examination of the lumbar spine was performed utilizing multiplanar and multi sequential imaging with and without contrast. 8 cc of gadavist was administered.  COMPARISON STUDY:None  FINDINGS:   There has been a posterior fusion from L4-S1. There are interbody disc spacers present at L4-L5 and L5-S1. Chronic loss of height of T11.  At L5-S1 is been a right hemilaminectomy. There is no central spinal canal stenosis. The neural foramina are not stenotic. There is no abnormal enhancing granulation tissue at this disc space level. There is a 2.7 x 1.1 cm nonenhancing fluid collection posterior and lateral to the thecal sac which may represent a small seroma.  At L4-L5 there has been a wide laminectomy. There is no central spinal canal stenosis. The neural foramina are not stenotic.  At L3-L4 there is a moderate concentric disc bulge. There is bilateral facet hypertrophy and ligamentum flavum thickening. There is moderate central spinal canal stenosis. There is moderate right foraminal stenosis and mild to moderate left foraminal stenosis  At L2-L3 there is a moderate disc bulge. There is bilateral facet hypertrophy and ligamentum flavum thickening. There is moderate central spinal canal stenosis. There is severe right foraminal stenosis. There is moderate to severe left foraminal stenosis.  At L1-L2 there is retrolisthesis. There is a right paracentral disc protrusion.  The central spinal canal is mildly stenotic. There is bilateral moderate to severe foraminal stenosis. There is intervertebral disc space narrowing.  At T12-L1 there is no disc herniation, spinal canal or foraminal stenosis.  The conus medullaris ends at the level of L1. -------------  MRI LUMBAR SPINE WITHOUT AND WITH CONTRAST  TECHNIQUE: Multiplanar and multiecho pulse sequences of the lumbar spine were obtained without and with intravenous contrast.  CONTRAST: 65mL MULTIHANCE GADOBENATE DIMEGLUMINE 529 MG/ML IV SOLN  COMPARISON: [facility] lumbar radiographs 02/05/2013. Intraoperative films 01/17/2007.  FINDINGS: Same numbering system used as in 2008, designating the fused levels L4-L5 and L5-S1. Sequelae of posterior and interbody fusion at those levels with mild susceptibility artifact. Postoperative changes to the overlying posterior paraspinal soft tissues. Mildly exaggerated upper lumbar lordosis. No marrow edema or evidence of acute osseous abnormality.  Visualized lower thoracic spinal cord is normal with conus medularis at L1-L2. No abnormal enhancement identified.  Negative visualized abdominal viscera.  T10-T11: Mild facet hypertrophy.  T11-T12: Anterior disc osteophyte complex. No significant stenosis.  T12-L1: Negative.  L1-L2: Disc bulge with superimposed right paracentral cephalad disc extrusion. Associated mild right lateral recess stenosis. No definite involvement of the right L1 foramen. No significant spinal stenosis at this level.  L2-L3: Broad-based disc protrusion with moderate facet and ligament flavum hypertrophy. Moderate spinal stenosis. Mild bilateral L2 foraminal stenosis.  L3-L4: Disc bulge with superimposed left paracentral cephalad disc extrusion (series 6, image 7 and series 4,  image 15). Superimposed moderate to severe facet and ligament flavum hypertrophy. Overall mild spinal stenosis, mild left lateral recess  stenosis, and mild left L3 foraminal stenosis.  L4-L5: Status post fusion and decompression. No stenosis.  L5-S1: Status post fusion and decompression. Small simple appearing fluid collection in the laminectomy space with no mass effect on the thecal sac. No stenosis.  IMPRESSION: 1. Sequelae of L4-L5 and L5-S1 fusion and decompression with no adverse features. 2. Adjacent segment disease at L3-L4 resulting in multifactorial mild spinal, left lateral recess, and left foraminal stenosis. 3. Similar to more pronounced upper lumbar disc degeneration, resulting in moderate multifactorial spinal stenosis at L2-L3, and mild right lateral recess stenosis at L1-L2.   Electronically Signed By: Lars Pinks M.D. On: 02/20/2013 16:38   She reports that she has never smoked. She has never used smokeless tobacco. No results for input(s): HGBA1C, LABURIC in the last 8760 hours.  Objective:  VS:  HT:    WT:   BMI:     BP:(!) 136/105  HR:92bpm  TEMP:97.9 F (36.6 C)(Oral)  RESP:  Physical Exam  Ortho Exam Imaging: No results found.  Past Medical/Family/Surgical/Social History: Medications & Allergies reviewed per EMR, new medications updated. Patient Active Problem List   Diagnosis Date Noted  . Chronic right-sided thoracic back pain 12/21/2016  . Right-sided low back pain without sciatica 12/21/2016  . 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 Chronic pain syndrome 12/21/2016  . Calculus, ureter 12/21/2016  . Obesity 12/21/2016  . Abdominal aortic aneurysm ([place]) 07/04/2016  . Chronic pancreatitis ([place]) 07/04/2016  . Dizziness 07/04/2016  . 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 Essential hypertension 07/04/2016  . Gastroesophageal reflux disease 07/04/2016  . Osteoarthritis 07/04/2016  . History of abdominal aortic aneurysm (AAA) 06/30/2016  . Dry eyes 02/10/2016  . Nuclear sclerosis 02/10/2016  . Type 2 diabetes mellitus without complication ([place]) 69/62/9528  . Abdominal wall  hernia 11/19/2015  . History of congestive heart failure 08/18/2015  . 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 Encounter for pancreatic duct stent exchange 08/10/2015  . Fatty liver 08/10/2015  . Aneurysm of thoracic aorta ([place]) 07/20/2015  . DDD (degenerative disc disease), lumbar 07/20/2015  . Hyperlipidemia 12/31/2014  . HTN (hypertension), benign 10/10/2014  . Diabetes mellitus type II, controlled, with no complications ([place]) 41/32/4401  . History of coronary artery disease 12/25/2013  . White coat hypertension 12/25/2013  . GERD (gastroesophageal reflux disease) 10/08/2012  . Nonischemic cardiomyopathy ([place]) 08/30/2011  . Pancreatic calcification 03/31/2011  . Angina pectoris ([place]) 02/24/2011  . Osteopenia 12/26/2008  . Migraine headache 04/25/2008  . Fibromyalgia 10/06/2006  . Depression 06/27/2006   Past Medical History:  Diagnosis Date  . Arthritis   . Diabetes mellitus without complication ([place])   . Heart disease   . Osteoarthritis   . Osteoporosis    History reviewed. No pertinent family history. Past Surgical History:  Procedure Laterality Date  . SPINAL FUSION     L4 to the sacrum fusion by Dr. Sherwood Gambler in 2008   Social History   Occupational History  . Not on file  Tobacco Use  . Smoking status: Never Smoker  . Smokeless tobacco: Never Used  Substance and Sexual Activity  . Alcohol use: No  . Drug use: Not on file  . Sexual activity: Not on file

## 2018-10-09 ENCOUNTER — Telehealth: Payer: Self-pay | Admitting: Physical Medicine and Rehabilitation

## 2018-10-09 DIAGNOSIS — M545 Low back pain, unspecified: Secondary | ICD-10-CM

## 2018-10-09 DIAGNOSIS — G8929 Other chronic pain: Secondary | ICD-10-CM

## 2018-10-09 DIAGNOSIS — M47816 Spondylosis without myelopathy or radiculopathy, lumbar region: Secondary | ICD-10-CM

## 2018-10-10 NOTE — Telephone Encounter (Signed)
Ok if criteria met

## 2018-10-11 NOTE — Telephone Encounter (Signed)
MRI ordered

## 2018-11-10 ENCOUNTER — Other Ambulatory Visit: Payer: Self-pay

## 2018-11-10 ENCOUNTER — Ambulatory Visit
Admission: RE | Admit: 2018-11-10 | Discharge: 2018-11-10 | Disposition: A | Discharge status: Home / Self Care | Payer: Medicare Other | Source: Ambulatory Visit | Attending: Physical Medicine and Rehabilitation | Admitting: Physical Medicine and Rehabilitation

## 2018-11-10 DIAGNOSIS — G8929 Other chronic pain: Secondary | ICD-10-CM

## 2018-11-10 DIAGNOSIS — M47816 Spondylosis without myelopathy or radiculopathy, lumbar region: Secondary | ICD-10-CM

## 2018-11-16 ENCOUNTER — Ambulatory Visit: Payer: Self-pay

## 2018-11-16 ENCOUNTER — Ambulatory Visit (INDEPENDENT_AMBULATORY_CARE_PROVIDER_SITE_OTHER): Payer: Medicare Other | Admitting: Physical Medicine and Rehabilitation

## 2018-11-16 ENCOUNTER — Encounter: Payer: Self-pay | Admitting: Physical Medicine and Rehabilitation

## 2018-11-16 DIAGNOSIS — M797 Fibromyalgia: Secondary | ICD-10-CM

## 2018-11-16 DIAGNOSIS — M48062 Spinal stenosis, lumbar region with neurogenic claudication: Secondary | ICD-10-CM

## 2018-11-16 DIAGNOSIS — M961 Postlaminectomy syndrome, not elsewhere classified: Secondary | ICD-10-CM

## 2018-11-16 DIAGNOSIS — M5416 Radiculopathy, lumbar region: Secondary | ICD-10-CM

## 2018-11-16 MED ORDER — BETAMETHASONE SOD PHOS & ACET 6 (3-3) MG/ML IJ SUSP
12.0000 mg | Freq: Once | INTRAMUSCULAR | Status: AC
  Administered 2018-11-16: 11:00:00 12 mg

## 2018-11-16 NOTE — Progress Notes (Signed)
 .  Numeric Pain Rating Scale and Functional Assessment Average Pain 6   In the last MONTH (on 0-10 scale) has pain interfered with the following?  1. General activity like being  able to carry out your everyday physical activities such as walking, climbing stairs, carrying groceries, or moving a chair?  Rating(9)   +Driver, -BT, -Dye Allergies.   

## 2018-11-30 ENCOUNTER — Telehealth: Payer: Self-pay | Admitting: Physical Medicine and Rehabilitation

## 2018-12-06 ENCOUNTER — Encounter: Payer: Self-pay | Admitting: Physical Medicine and Rehabilitation

## 2018-12-06 NOTE — Telephone Encounter (Signed)
Could do bilateral sacroiliac joint injections which would be below the fusion and/or have her see spine or neurosurgeon for evaluation.  Also images from that date are not in epic

## 2018-12-06 NOTE — Progress Notes (Signed)
Jakayah Timbrook - 78 y.o. female MRN [id number]  Date of birth: Jun 03, 1940  Office Visit Note: Visit Date: 11/16/2018 PCP: Patient, No Pcp Per Referred by: No ref. provider found  Subjective: Chief Complaint  Patient presents with  . Lower Back - Pain  . Left Hip - Pain  . Right Hip - Pain   HPI: Charquita Ahmann is a 78 y.o. female who comes in today For reevaluation management of low back pain and bilateral hip and leg pain.  Patient lives in [place] is with her daughter today provides some of the history.  We last saw her in February completed facet joint blocks which had been in the past somewhat beneficial but this time did not seem to help very much but she had a hospitalization in the interim with what appeared to be some type of pneumonia.  She is been doing better recently.  She did call in leading up to a new appointment with increasing worsening symptoms that she felt like were newer symptoms and so we did obtain an MRI of the lumbar spine.  Prior MRI was 2016.  This MRI is reviewed with the patient today using spine models and imaging.  She does have prior PLIF at L4-5 and L5-S1 with adjacent level severe stenosis.  Her pain is really across more of the low back somewhat refers in the buttocks and could be sacroiliac joint type pain given the posterior lumbar fusion.  She reports her pain is a 6 out of 10 and medications really are not that effective at this point.  She does do home exercises and had had multiple bouts of physical therapy in the past.  She does carry diagnosis of fibromyalgia as well.  She is getting most of her pain with standing ambulating some pain sitting in different positions.  No focal weakness no bowel or bladder difficulties no fevers chills or night sweats or any other red flag complaints.  No specific injury or falls.  Review of Systems  Constitutional: Positive for malaise/fatigue. Negative for chills, fever and weight loss.  HENT: Negative for  hearing loss and sinus pain.   Eyes: Negative for blurred vision, double vision and photophobia.  Respiratory: Negative for cough and shortness of breath.   Cardiovascular: Negative for chest pain, palpitations and leg swelling.  Gastrointestinal: Negative for abdominal pain, nausea and vomiting.  Genitourinary: Negative for flank pain.  Musculoskeletal: Positive for back pain. Negative for myalgias.  Skin: Negative for itching and rash.  Neurological: Positive for weakness. Negative for tremors and focal weakness.  Endo/Heme/Allergies: Negative.   Psychiatric/Behavioral: Negative for depression.  All other systems reviewed and are negative.  Otherwise per HPI.  Assessment & Plan: Visit Diagnoses:  1. Lumbar radiculopathy   2. Spinal stenosis of lumbar region with neurogenic claudication   3. Post laminectomy syndrome   4. Fibromyalgia     Plan: Findings:  Chronic worsening severe axial low back pain some referral in the buttock region nothing really down the legs although it somewhat down both sides of her thighs.  It is right more than left and seems to be radicular probably related to the adjacent level stenosis above her fusion.  We discussed her problem at length and we decided to complete bilateral L3 transforaminal epidural steroid injection.  Depending on relief would look at repeat injection if it were to get more relief out of it versus possible sacroiliac joint injection below the fusion which can be a problem when folks are  fused to the sacrum.  Otherwise she might be a candidate for spinal cord stimulator trial or surgical approach to adjacent level disease.  We will see how she does with the injection.    Meds & Orders:  Meds ordered this encounter  Medications  . betamethasone acetate-betamethasone sodium phosphate (CELESTONE) injection 12 mg    Orders Placed This Encounter  Procedures  . XR C-ARM NO REPORT  . Epidural Steroid injection    Follow-up: Return if  symptoms worsen or fail to improve.   Procedures: No procedures performed  Lumbosacral Transforaminal Epidural Steroid Injection - Sub-Pedicular Approach with Fluoroscopic Guidance  Patient: Temesha Shuford      Date of Birth: March 17, 1940 MRN: [id number] PCP: Patient, No Pcp Per      Visit Date: 11/16/2018   Universal Protocol:    Date/Time: 11/16/2018  Consent Given By: the patient  Position: PRONE  Additional Comments: Vital signs were monitored before and after the procedure. Patient was prepped and draped in the usual sterile fashion. The correct patient, procedure, and site was verified.   Injection Procedure Details:  Procedure Site One Meds Administered:  Meds ordered this encounter  Medications  . betamethasone acetate-betamethasone sodium phosphate (CELESTONE) injection 12 mg    Laterality: Bilateral  Location/Site:  L4-L5  Needle size: 22 G  Needle type: Spinal  Needle Placement: Transforaminal  Findings:    -Comments: Excellent flow of contrast along the nerve and into the epidural space.  Procedure Details: After squaring off the end-plates to get a true AP view, the C-arm was positioned so that an oblique view of the foramen as noted above was visualized. The target area is just inferior to the "nose of the scotty dog" or sub pedicular. The soft tissues overlying this structure were infiltrated with 2-3 ml. of 1% Lidocaine without Epinephrine.  The spinal needle was inserted toward the target using a "trajectory" view along the fluoroscope beam.  Under AP and lateral visualization, the needle was advanced so it did not puncture dura and was located close the 6 O'Clock position of the pedical in AP tracterory. Biplanar projections were used to confirm position. Aspiration was confirmed to be negative for CSF and/or blood. A 1-2 ml. volume of Isovue-250 was injected and flow of contrast was noted at each level. Radiographs were obtained for documentation  purposes.   After attaining the desired flow of contrast documented above, a 0.5 to 1.0 ml test dose of 0.25% Marcaine was injected into each respective transforaminal space.  The patient was observed for 90 seconds post injection.  After no sensory deficits were reported, and normal lower extremity motor function was noted,   the above injectate was administered so that equal amounts of the injectate were placed at each foramen (level) into the transforaminal epidural space.   Additional Comments:  The patient tolerated the procedure well Dressing: 2 x 2 sterile gauze and Band-Aid    Post-procedure details: Patient was observed during the procedure. Post-procedure instructions were reviewed.  Patient left the clinic in stable condition.    Clinical History: MRI LUMBAR SPINE WITHOUT CONTRAST  TECHNIQUE: Multiplanar, multisequence MR imaging of the lumbar spine was performed. No intravenous contrast was administered.  COMPARISON:  12/18/2015  FINDINGS: Segmentation:  Standard lumbar numbering  Alignment:  3 mm degenerative retrolisthesis at L1-2 and L2-3.  Vertebrae: L4-5 and L5-S1 PLIF with solid arthrodesis. No fracture, discitis, or aggressive bone lesion.  Conus medullaris and cauda equina: Conus extends to the L2 level. Conus  appears normal. Peripheral nerve roots at L4 and L5 but no clumping or asymmetry for definitive arachnoiditis.  Paraspinal and other soft tissues: Expected scarring of intrinsic back muscles.  Disc levels:  T12- L1: Spondylosis.  No impingement  L1-L2: Advanced and progressive degenerative disc collapse with circumferential bulging and endplate ridging. Endplate sclerosis has developed. Moderate left more than right foraminal narrowing without definite compression. Spinal stenosis is mild  L2-L3: Disc narrowing with circumferential bulging. Posterior element hypertrophy. Moderate spinal stenosis with cauda equina crowding,  borderline advanced. Right more than left foraminal narrowing, moderate on the right.  L3-L4: Disc narrowing and bulging with chronic small up turning disc protrusion. Degenerative posterior element hypertrophy with moderate right more than left foraminal stenosis. Moderate, borderline advanced, spinal stenosis that is progressed.  L4-L5: PLIF with solid arthrodesis.  No impingement  L5-S1:PLIF with solid arthrodesis and no impingement  IMPRESSION: 1. L1-2 to L3-4 disc and facet degeneration with progression from 2017. L2-3 and L3-4 moderate borderline advanced spinal stenosis. 2. L4-5 and L5-S1 PLIF with solid arthrodesis and no residual impingement. 3. Up to moderate foraminal narrowing as described above.   Electronically Signed   By: Monte Fantasia M.D.   On: 11/11/2018 10:16   She reports that she has never smoked. She has never used smokeless tobacco. No results for input(s): HGBA1C, LABURIC in the last 8760 hours.  Objective:  VS:  HT:    WT:   BMI:     BP:   HR: bpm  TEMP: ( )  RESP:  Physical Exam Vitals signs and nursing note reviewed.  Constitutional:      General: She is not in acute distress.    Appearance: Normal appearance. She is well-developed. She is obese. She is not ill-appearing.  HENT:     Head: Normocephalic and atraumatic.     Nose: Nose normal.     Mouth/Throat:     Mouth: Mucous membranes are moist.     Pharynx: Oropharynx is clear.  Eyes:     Conjunctiva/sclera: Conjunctivae normal.     Pupils: Pupils are equal, round, and reactive to light.  Neck:     Musculoskeletal: Normal range of motion and neck supple.  Cardiovascular:     Rate and Rhythm: Normal rate and regular rhythm.     Pulses: Normal pulses.  Pulmonary:     Effort: Pulmonary effort is normal. No respiratory distress.  Abdominal:     General: There is no distension.     Palpations: Abdomen is soft.     Tenderness: There is no guarding.  Musculoskeletal:     Right  lower leg: No edema.     Left lower leg: No edema.     Comments: Patient arises slowly from seated position to extension she has forward flexed lumbar spine with stiffness of the lumbar spine.  She does have some pain with extension.  She does have positive Fortin finger sign over both PSIS.  She does have pain over the PSIS.  No pain with trochanter no pain with hip rotation.  Good distal strength without clonus.  Skin:    General: Skin is warm and dry.     Findings: No erythema or rash.  Neurological:     General: No focal deficit present.     Mental Status: She is alert and oriented to person, place, and time.     Sensory: No sensory deficit.     Motor: No abnormal muscle tone.     Coordination: Coordination  normal.     Gait: Gait normal.  Psychiatric:        Mood and Affect: Mood normal.        Behavior: Behavior normal.        Thought Content: Thought content normal.     Ortho Exam Imaging: No results found.  Past Medical/Family/Surgical/Social History: Medications & Allergies reviewed per EMR, new medications updated. Patient Active Problem List   Diagnosis Date Noted  . Chronic right-sided thoracic back pain 12/21/2016  . Right-sided low back pain without sciatica 12/21/2016  . 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 Chronic pain syndrome 12/21/2016  . Calculus, ureter 12/21/2016  . Obesity 12/21/2016  . Abdominal aortic aneurysm ([place]) 07/04/2016  . Chronic pancreatitis ([place]) 07/04/2016  . Dizziness 07/04/2016  . 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 Essential hypertension 07/04/2016  . Gastroesophageal reflux disease 07/04/2016  . Osteoarthritis 07/04/2016  . History of abdominal aortic aneurysm (AAA) 06/30/2016  . Dry eyes 02/10/2016  . Nuclear sclerosis 02/10/2016  . Type 2 diabetes mellitus without complication ([place]) [id number]  . Abdominal wall hernia 11/19/2015  . History of congestive heart failure 08/18/2015  . 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  Encounter for pancreatic duct stent exchange 08/10/2015  . Fatty liver 08/10/2015  . Aneurysm of thoracic aorta ([place]) 07/20/2015  . DDD (degenerative disc disease), lumbar 07/20/2015  . Hyperlipidemia 12/31/2014  . HTN (hypertension), benign 10/10/2014  . Diabetes mellitus type II, controlled, with no complications ([place]) [id number]  . History of coronary artery disease 12/25/2013  . White coat hypertension 12/25/2013  . GERD (gastroesophageal reflux disease) 10/08/2012  . Nonischemic cardiomyopathy ([place]) 08/30/2011  . Pancreatic calcification 03/31/2011  . Angina pectoris ([place]) 02/24/2011  . Osteopenia 12/26/2008  . Migraine headache 04/25/2008  . Fibromyalgia 10/06/2006  . Depression 06/27/2006   Past Medical History:  Diagnosis Date  . Arthritis   . Diabetes mellitus without complication ([place])   . Heart disease   . Osteoarthritis   . Osteoporosis    History reviewed. No pertinent family history. Past Surgical History:  Procedure Laterality Date  . SPINAL FUSION     L4 to the sacrum fusion by Dr. Sherwood Gambler in 2008   Social History   Occupational History  . Not on file  Tobacco Use  . Smoking status: Never Smoker  . Smokeless tobacco: Never Used  Substance and Sexual Activity  . Alcohol use: No  . Drug use: Not on file  . Sexual activity: Not on file

## 2018-12-06 NOTE — Procedures (Signed)
Lumbosacral Transforaminal Epidural Steroid Injection - Sub-Pedicular Approach with Fluoroscopic Guidance  Patient: Zoe Fleming      Date of Birth: August 18, 1940 MRN: [id number] PCP: Patient, No Pcp Per      Visit Date: 11/16/2018   Universal Protocol:    Date/Time: 11/16/2018  Consent Given By: the patient  Position: PRONE  Additional Comments: Vital signs were monitored before and after the procedure. Patient was prepped and draped in the usual sterile fashion. The correct patient, procedure, and site was verified.   Injection Procedure Details:  Procedure Site One Meds Administered:  Meds ordered this encounter  Medications  . betamethasone acetate-betamethasone sodium phosphate (CELESTONE) injection 12 mg    Laterality: Bilateral  Location/Site:  L4-L5  Needle size: 22 G  Needle type: Spinal  Needle Placement: Transforaminal  Findings:    -Comments: Excellent flow of contrast along the nerve and into the epidural space.  Procedure Details: After squaring off the end-plates to get a true AP view, the C-arm was positioned so that an oblique view of the foramen as noted above was visualized. The target area is just inferior to the "nose of the scotty dog" or sub pedicular. The soft tissues overlying this structure were infiltrated with 2-3 ml. of 1% Lidocaine without Epinephrine.  The spinal needle was inserted toward the target using a "trajectory" view along the fluoroscope beam.  Under AP and lateral visualization, the needle was advanced so it did not puncture dura and was located close the 6 O'Clock position of the pedical in AP tracterory. Biplanar projections were used to confirm position. Aspiration was confirmed to be negative for CSF and/or blood. A 1-2 ml. volume of Isovue-250 was injected and flow of contrast was noted at each level. Radiographs were obtained for documentation purposes.   After attaining the desired flow of contrast documented above, a 0.5  to 1.0 ml test dose of 0.25% Marcaine was injected into each respective transforaminal space.  The patient was observed for 90 seconds post injection.  After no sensory deficits were reported, and normal lower extremity motor function was noted,   the above injectate was administered so that equal amounts of the injectate were placed at each foramen (level) into the transforaminal epidural space.   Additional Comments:  The patient tolerated the procedure well Dressing: 2 x 2 sterile gauze and Band-Aid    Post-procedure details: Patient was observed during the procedure. Post-procedure instructions were reviewed.  Patient left the clinic in stable condition.

## 2018-12-07 NOTE — Telephone Encounter (Signed)
Left message #1

## 2019-04-25 ENCOUNTER — Ambulatory Visit: Payer: Medicare Other | Admitting: Family Medicine

## 2019-05-03 ENCOUNTER — Ambulatory Visit: Payer: Medicare Other

## 2020-03-12 IMAGING — MR MR LUMBAR SPINE W/O CM
5 series · 32 of 48 positions shown · non-contrast
Comparison: 12/18/2015

CLINICAL DATA: Chronic mechanical back pain

EXAM:
MRI LUMBAR SPINE WITHOUT CONTRAST
TECHNIQUE: Multiplanar, multisequence MR imaging of the lumbar spine was
performed. No intravenous contrast was administered.

[Series 3: T1 · sagittal · 4.0mm · 0.88mm/px · 6 of 15 slices shown (1 of 2)]
[im 1/15]
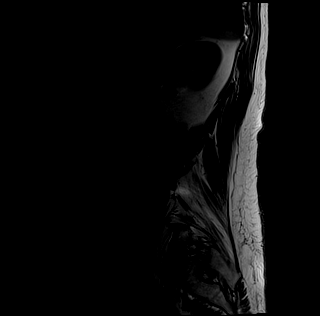
[im 3/15]
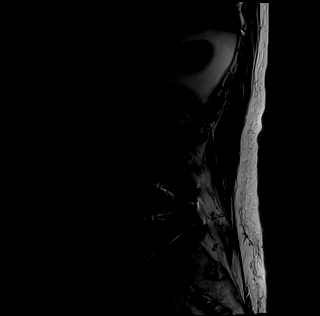
[im 6/15]
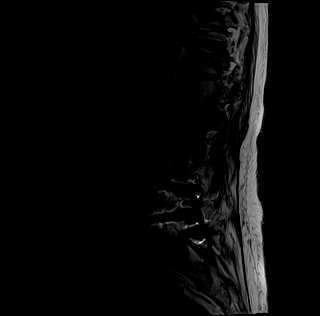
[im 9/15]
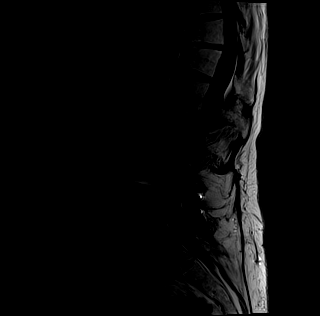
[im 12/15]
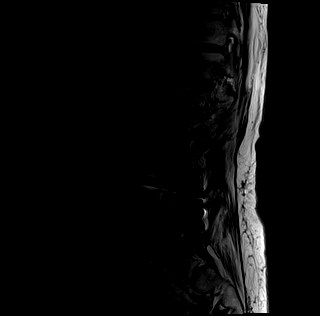
[im 15/15]
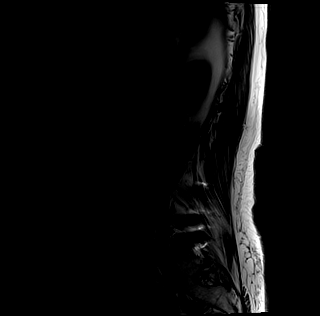

[Series 4: T2 · sagittal · 4.0mm · 0.88mm/px · 6 of 15 slices shown (1 of 2)]
[im 1/15]
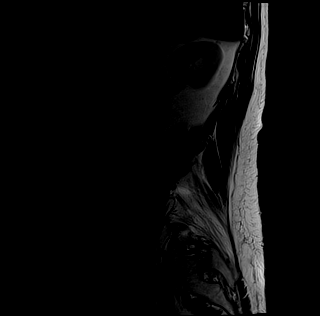
[im 3/15]
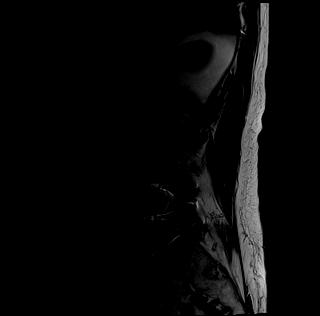
[im 6/15]
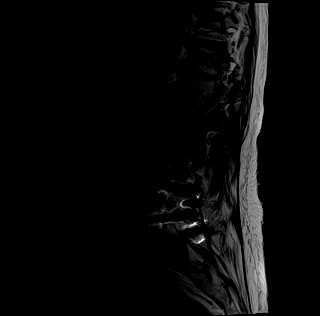
[im 9/15]
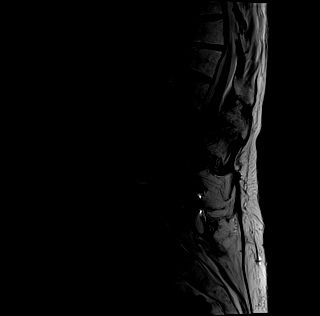
[im 12/15]
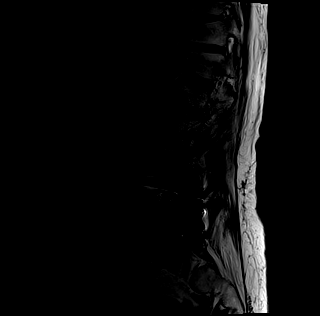
[im 15/15]
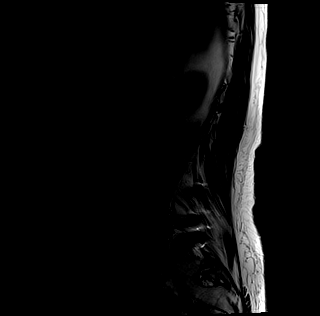

[Series 5: tirm sag · sagittal · 4.0mm · 0.55mm/px · 2 of 15 slices shown]
[im 1/15]
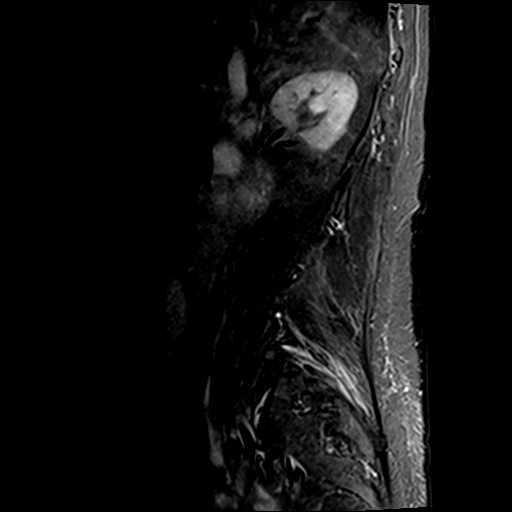
[im 3/15]
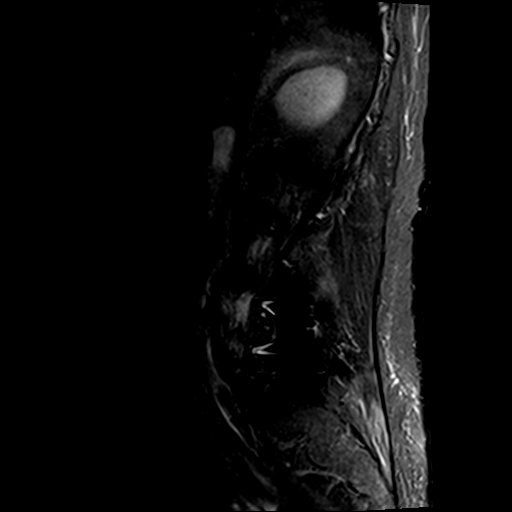

[Series 6: T2 · axial · 4.0mm · 0.70mm/px · z∈[-44,+148]mm · 9 of 37 slices shown (2 of 2)]
[im 1/37]
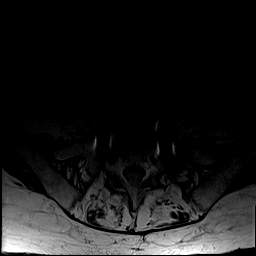
[im 6/37]
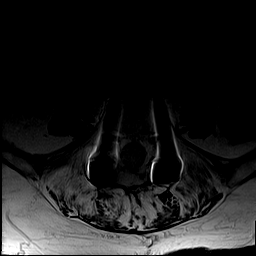
[im 11/37]
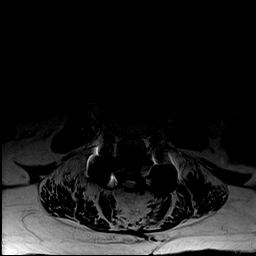
[im 16/37]
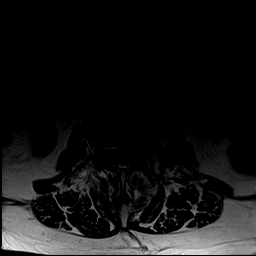
[im 19/37]
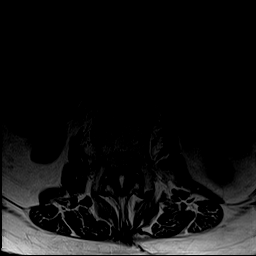
[im 21/37]
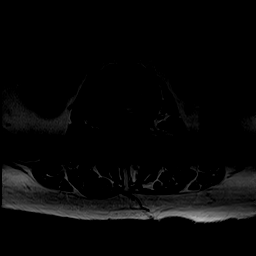
[im 26/37]
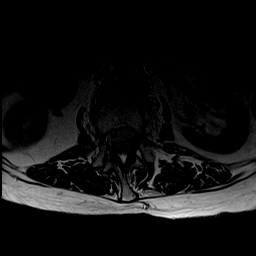
[im 31/37]
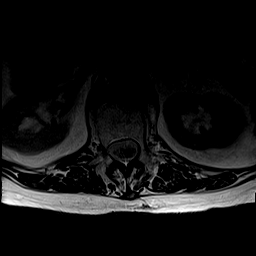
[im 37/37]
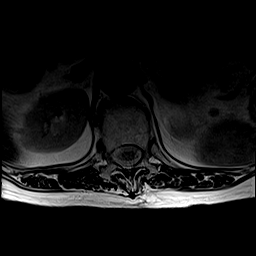

[Series 7: T1 · axial · 4.0mm · 0.39mm/px · z∈[-45,+150]mm · 9 of 37 slices shown (2 of 2)]
[im 1/37]
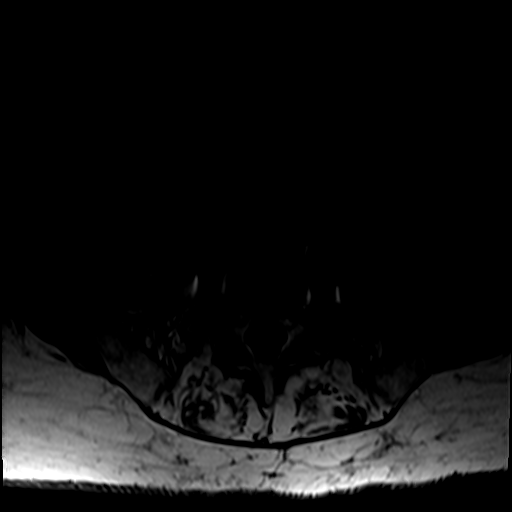
[im 6/37]
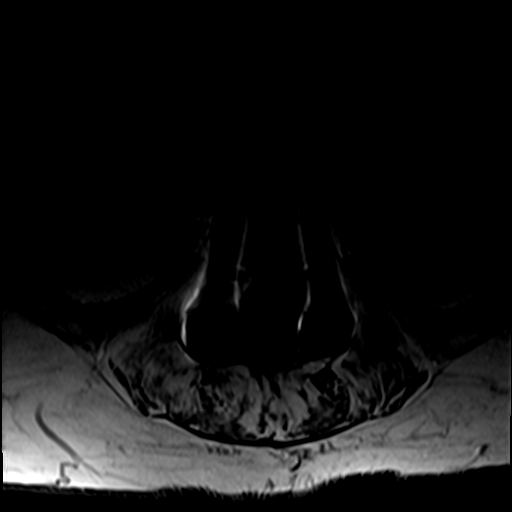
[im 11/37]
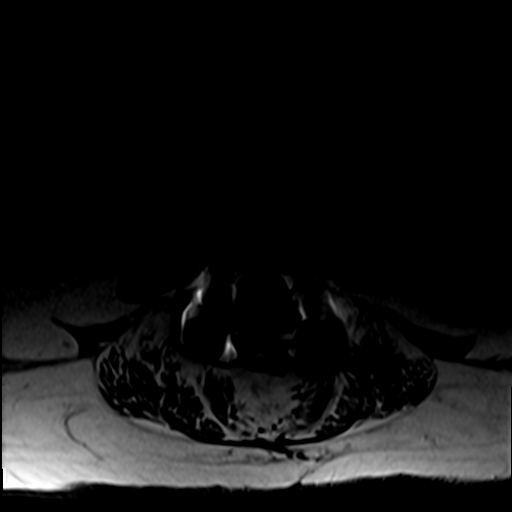
[im 16/37]
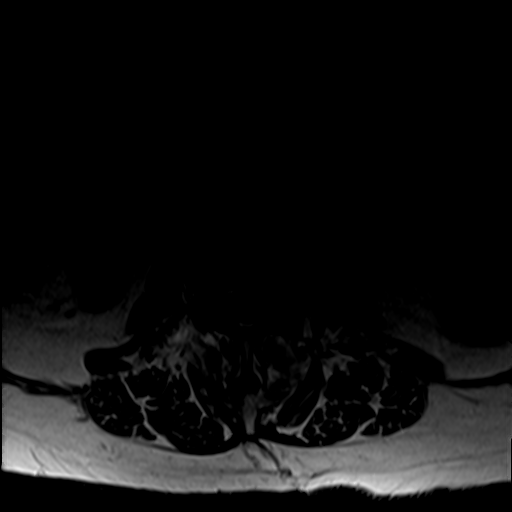
[im 19/37]
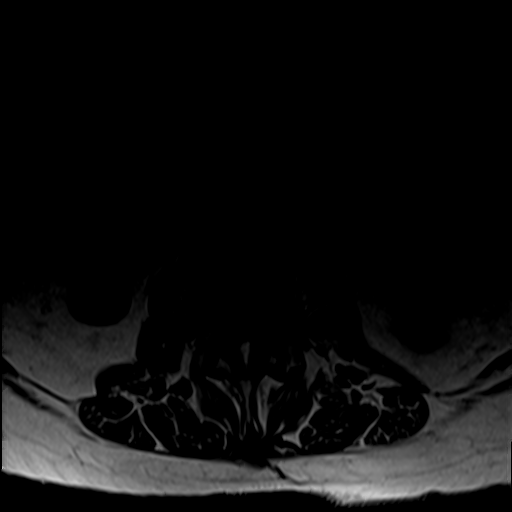
[im 21/37]
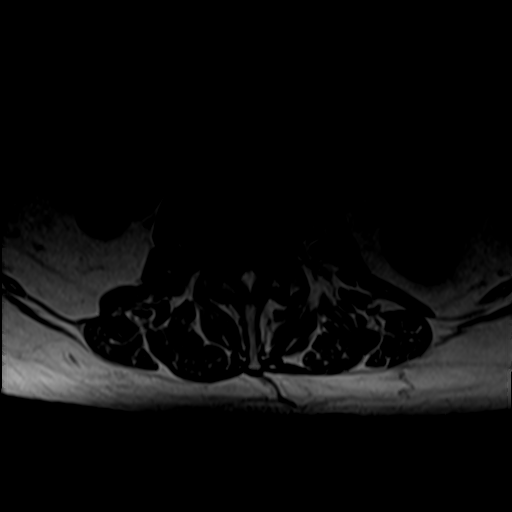
[im 26/37]
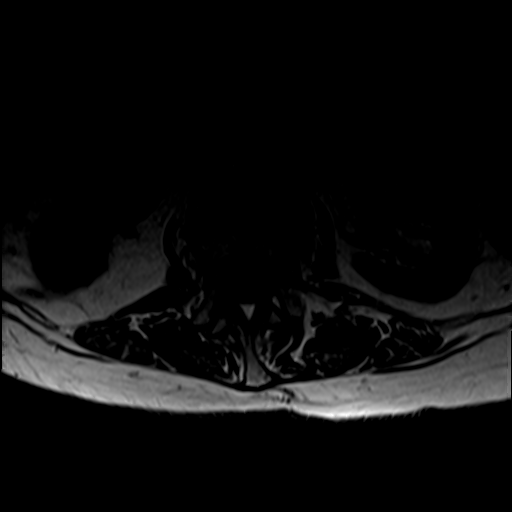
[im 31/37]
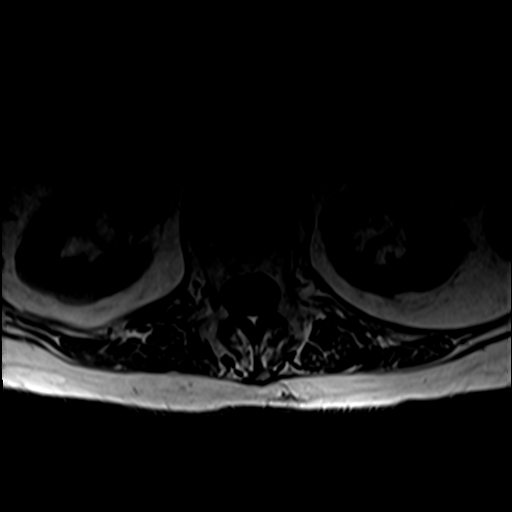
[im 37/37]
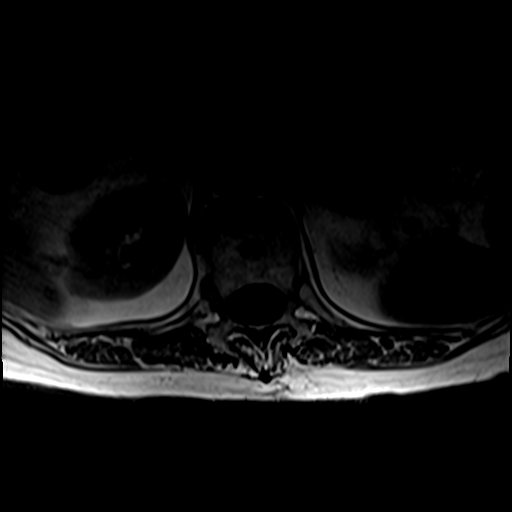

[32 of 48 positions shown; findings below may reference images not displayed]

FINDINGS: Segmentation:  Standard lumbar numbering

Alignment:  3 mm degenerative retrolisthesis at L1-2 and L2-3.

Vertebrae: L4-5 and L5-S1 PLIF with solid arthrodesis. No fracture,
discitis, or aggressive bone lesion.

Conus medullaris and cauda equina: Conus extends to the L2 level.
Conus appears normal. Peripheral nerve roots at L4 and L5 but no
clumping or asymmetry for definitive arachnoiditis.

Paraspinal and other soft tissues: Expected scarring of intrinsic
back muscles.

Disc levels:

T12- L1: Spondylosis.  No impingement

L1-L2: Advanced and progressive degenerative disc collapse with
circumferential bulging and endplate ridging. Endplate sclerosis has
developed. Moderate left more than right foraminal narrowing without
definite compression. Spinal stenosis is mild

L2-L3: Disc narrowing with circumferential bulging. Posterior
element hypertrophy. Moderate spinal stenosis with cauda equina
crowding, borderline advanced. Right more than left foraminal
narrowing, moderate on the right.

L3-L4: Disc narrowing and bulging with chronic small up turning disc
protrusion. Degenerative posterior element hypertrophy with moderate
right more than left foraminal stenosis. Moderate, borderline
advanced, spinal stenosis that is progressed.

L4-L5: PLIF with solid arthrodesis.  No impingement

L5-S1:PLIF with solid arthrodesis and no impingement
IMPRESSION: 1. L1-2 to L3-4 disc and facet degeneration with progression from
5590. L2-3 and L3-4 moderate borderline advanced spinal stenosis.
2. L4-5 and L5-S1 PLIF with solid arthrodesis and no residual
impingement.
3. Up to moderate foraminal narrowing as described above.

## 2021-03-23 ENCOUNTER — Other Ambulatory Visit: Payer: Self-pay

## 2021-03-23 ENCOUNTER — Ambulatory Visit (INDEPENDENT_AMBULATORY_CARE_PROVIDER_SITE_OTHER): Payer: Medicare Other | Admitting: Physician Assistant

## 2021-03-23 ENCOUNTER — Encounter: Payer: Self-pay | Admitting: Physician Assistant

## 2021-03-23 DIAGNOSIS — C44521 Squamous cell carcinoma of skin of breast: Secondary | ICD-10-CM

## 2021-03-23 DIAGNOSIS — Z86018 Personal history of other benign neoplasm: Secondary | ICD-10-CM

## 2021-03-23 DIAGNOSIS — Z85828 Personal history of other malignant neoplasm of skin: Secondary | ICD-10-CM

## 2021-03-23 DIAGNOSIS — D485 Neoplasm of uncertain behavior of skin: Secondary | ICD-10-CM | POA: Diagnosis not present

## 2021-03-23 DIAGNOSIS — Z1283 Encounter for screening for malignant neoplasm of skin: Secondary | ICD-10-CM | POA: Diagnosis not present

## 2021-03-23 NOTE — Patient Instructions (Signed)

## 2021-03-25 ENCOUNTER — Encounter: Payer: Self-pay | Admitting: Physician Assistant

## 2021-03-25 NOTE — Progress Notes (Signed)
New Patient   Subjective  Zoe Fleming is a 81 y.o. female who presents for the following: New Patient (Initial Visit) (Patient here today with her daughter Zoe Fleming) for removal of lesion on her abdomen x 1 year no bleeding, crusty, no pain. Patient states she has a few lesions on her chest that she would like checked also x 1 year no bleeding, just crusty. Personal history of atypical moles, and non mole skin cancer. No family history of atypical moles, melanoma or non mole skin cancer. ).   The following portions of the chart were reviewed this encounter and updated as appropriate:  Tobacco   Allergies   Meds   Problems   Med Hx   Surg Hx   Fam Hx       Objective  Well appearing patient in no apparent distress; mood and affect are within normal limits.  A full examination was performed including scalp, head, eyes, ears, nose, lips, neck, chest, axillae, abdomen, back, buttocks, bilateral upper extremities, bilateral lower extremities, hands, feet, fingers, toes, fingernails, and toenails. All findings within normal limits unless otherwise noted below.  Right Abdomen (side) - Lower Hyperkeratotic yellow nodule with pink base.        Right Abdomen (side) - Upper Brown plaque with hyperkeratotic scale       Right Breast        Assessment & Plan  Neoplasm of uncertain behavior of skin (2) Right Abdomen (side) - Lower  Skin / nail biopsy Type of biopsy: tangential   Informed consent: discussed and consent obtained   Timeout: patient name, date of birth, surgical site, and procedure verified   Anesthesia: the lesion was anesthetized in a standard fashion   Anesthetic:  1% lidocaine w/ epinephrine 1-100,000 local infiltration Instrument used: flexible razor blade   Hemostasis achieved with: aluminum chloride and electrodesiccation   Outcome: patient tolerated procedure well   Post-procedure details: wound care instructions given    Destruction of  lesion Complexity: simple   Destruction method: electrodesiccation and curettage   Informed consent: discussed and consent obtained   Timeout:  patient name, date of birth, surgical site, and procedure verified Anesthesia: the lesion was anesthetized in a standard fashion   Anesthetic:  1% lidocaine w/ epinephrine 1-100,000 local infiltration Curettage performed in three different directions: Yes   Electrodesiccation performed over the curetted area: Yes   Curettage cycles:  3 Margin per side (cm):  0.1 Final wound size (cm):  1 Hemostasis achieved with:  aluminum chloride and electrodesiccation Outcome: patient tolerated procedure well with no complications   Post-procedure details: wound care instructions given    Specimen 2 - Surgical pathology Differential Diagnosis: R/O BCC VS SCC, TXPBX  Check Margins: NO  Right Abdomen (side) - Upper  Skin / nail biopsy Type of biopsy: tangential   Informed consent: discussed and consent obtained   Timeout: patient name, date of birth, surgical site, and procedure verified   Anesthesia: the lesion was anesthetized in a standard fashion   Anesthetic:  1% lidocaine w/ epinephrine 1-100,000 local infiltration Instrument used: flexible razor blade   Hemostasis achieved with: aluminum chloride and electrodesiccation   Outcome: patient tolerated procedure well   Post-procedure details: wound care instructions given    Specimen 3 - Surgical pathology Differential Diagnosis: R/O ATYPIA  Check Margins: YES  SCC (squamous cell carcinoma), breast, right Right Breast  Skin / nail biopsy Type of biopsy: tangential   Informed consent: discussed and consent obtained  Timeout: patient name, date of birth, surgical site, and procedure verified   Anesthesia: the lesion was anesthetized in a standard fashion   Anesthetic:  1% lidocaine w/ epinephrine 1-100,000 local infiltration Instrument used: flexible razor blade   Hemostasis achieved with:  aluminum chloride and electrodesiccation   Outcome: patient tolerated procedure well   Post-procedure details: wound care instructions given    Destruction of lesion Complexity: simple   Destruction method: electrodesiccation and curettage   Informed consent: discussed and consent obtained   Timeout:  patient name, date of birth, surgical site, and procedure verified Anesthesia: the lesion was anesthetized in a standard fashion   Anesthetic:  1% lidocaine w/ epinephrine 1-100,000 local infiltration Curettage performed in three different directions: Yes   Electrodesiccation performed over the curetted area: Yes   Curettage cycles:  3 Margin per side (cm):  0.1 Final wound size (cm):  1.5 Hemostasis achieved with:  aluminum chloride and electrodesiccation Outcome: patient tolerated procedure well with no complications   Post-procedure details: wound care instructions given    Specimen 1 - Surgical pathology Differential Diagnosis: R/O BCC VS SCC, TXPBX  Check Margins: NO     I, Kaziah Krizek, PA-C, have reviewed all documentation's for this visit.  The documentation on 03/25/21 for the exam, diagnosis, procedures and orders are all accurate and complete.

## 2021-03-30 ENCOUNTER — Telehealth: Payer: Self-pay | Admitting: Physician Assistant

## 2021-03-30 NOTE — Telephone Encounter (Signed)
Patient calling for Bx results.

## 2021-03-30 NOTE — Telephone Encounter (Signed)
Pathology to patient- 6 month recheck scheduled.

## 2021-05-31 ENCOUNTER — Telehealth: Payer: Self-pay | Admitting: Physical Medicine and Rehabilitation

## 2021-05-31 NOTE — Telephone Encounter (Signed)
Pt daughter called wondering if she can come see [facility] for her back pain?  ? ?CB [phone] ?

## 2021-06-03 ENCOUNTER — Encounter: Payer: Self-pay | Admitting: Physical Medicine and Rehabilitation

## 2021-06-03 ENCOUNTER — Ambulatory Visit (INDEPENDENT_AMBULATORY_CARE_PROVIDER_SITE_OTHER): Payer: Medicare Other | Admitting: Physical Medicine and Rehabilitation

## 2021-06-03 VITALS — BP 135/84 | HR 80

## 2021-06-03 DIAGNOSIS — M48062 Spinal stenosis, lumbar region with neurogenic claudication: Secondary | ICD-10-CM | POA: Diagnosis not present

## 2021-06-03 DIAGNOSIS — M961 Postlaminectomy syndrome, not elsewhere classified: Secondary | ICD-10-CM

## 2021-06-03 DIAGNOSIS — R269 Unspecified abnormalities of gait and mobility: Secondary | ICD-10-CM

## 2021-06-03 DIAGNOSIS — M5416 Radiculopathy, lumbar region: Secondary | ICD-10-CM

## 2021-06-03 DIAGNOSIS — M4726 Other spondylosis with radiculopathy, lumbar region: Secondary | ICD-10-CM

## 2021-06-03 NOTE — Progress Notes (Signed)
? ?Zoe Fleming - 81 y.o. female MRN [id number]  Date of birth: 09-19-1940 ? ?Office Visit Note: ?Visit Date: 06/03/2021 ?PCP: Ollen Bowl, MD ?Referred by: Ollen Bowl, MD ? ?Subjective: ?Chief Complaint  ?Patient presents with  ? Lower Back - Pain  ? Left Leg - Pain  ? Left Knee - Pain  ? ?HPI: Zoe Fleming is a 81 y.o. female who comes in today for evaluation of chronic, worsening and severe left sided lower back pain radiating to left anterolateral thigh. Patients daughter accompanies her during our visit today. Patient reports history of chronic lower back pain for several years. We have not seen this patient in our office since 2020. Patient reports her pain is exacerbated by sitting and standing, describes as throbbing, aching and tingling sensation and currently rates as 8 out of 10. Patient reports some relief of pain with rest, heating pad and use of medications. Patient is currently being treated for chronic pain by Dr. Nicholaus Bloom at [facility] and is prescribed 240 tablets of 5-325 mg Percocet monthly. Patient does have history of L4-L5 and L5-S1 lumbar fusion performed by Dr. Jovita Gamma many years ago. Patients lumbar MRI from 2020 exhibits stable L4-L5 and L5-S1 PLIF and moderate borderline advanced spinal canal stenosis noted at L2-L3 and L3-L4. Patient has had numerous treatments in our office over the years including lumbar epidural steroid injections, facet joint blocks, and sacroiliac joint injection. Patient states her pain has gradually increased over the last several weeks and would like to discuss repeating epidural steroid injection. Patient currently using walker to assist with ambulation and prevent falls. Patient denies focal weakness. Patient denies recent trauma or falls.  ? ?Review of Systems  ?Musculoskeletal:  Positive for back pain.  ?Neurological:  Positive for tingling. Negative for sensory change, focal weakness and weakness.  ?All other  systems reviewed and are negative. Otherwise per HPI. ? ?Assessment & Plan: ?Visit Diagnoses:  ?  ICD-10-CM   ?1. Lumbar radiculopathy  M54.16   ?  ?2. Other spondylosis with radiculopathy, lumbar region  M47.26   ?  ?3. Post laminectomy syndrome  M96.1   ?  ?4. Spinal stenosis of lumbar region with neurogenic claudication  M48.062   ?  ?   ?Plan: Findings:  ?Chronic, worsening and severe left sided lower back pain radiating to left anterolateral thigh. Patient continues to have severe pain with conservative therapies such as rest, heating pad and use of medications. Patients clinical presentation and exam are consistent with neurogenic claudication as a result of spinal canal stenosis, she does have adjacent level stenosis at L2-L3 and L3-L4 above her fusion. I did speak with patient in detail regarding treatment plan. We do feel the next step is to have patient follow up with Dr. Nicholaus Bloom at [facility] as he is currently treating her chronic pain and is able to perform lumbar epidural steroid injections. We feel patient would benefit more from a comprehensive care approach as we do not manage chronic pain with opioid medications in our office. Patient states she has an upcoming appointment with Dr. Hardin Negus on 06/08/2021. Patient instructed to follow up with as needed. No red flag symptoms noted upon exam today.   ? ?Meds & Orders: No orders of the defined types were placed in this encounter. ? No orders of the defined types were placed in this encounter. ?  ?Follow-up: Return if symptoms worsen or fail to improve.  ? ?Procedures: ?No procedures  performed  ?   ? ?Clinical History: ?EXAM: ?MRI LUMBAR SPINE WITHOUT CONTRAST ?  ?TECHNIQUE: ?Multiplanar, multisequence MR imaging of the lumbar spine was ?performed. No intravenous contrast was administered. ?  ?COMPARISON:  12/18/2015 ?  ?FINDINGS: ?Segmentation:  Standard lumbar numbering ?  ?Alignment:  3 mm degenerative retrolisthesis at L1-2 and L2-3. ?   ?Vertebrae: L4-5 and L5-S1 PLIF with solid arthrodesis. No fracture, ?discitis, or aggressive bone lesion. ?  ?Conus medullaris and cauda equina: Conus extends to the L2 level. ?Conus appears normal. Peripheral nerve roots at L4 and L5 but no ?clumping or asymmetry for definitive arachnoiditis. ?  ?Paraspinal and other soft tissues: Expected scarring of intrinsic ?back muscles. ?  ?Disc levels: ?  ?T12- L1: Spondylosis.  No impingement ?  ?L1-L2: Advanced and progressive degenerative disc collapse with ?circumferential bulging and endplate ridging. Endplate sclerosis has ?developed. Moderate left more than right foraminal narrowing without ?definite compression. Spinal stenosis is mild ?  ?L2-L3: Disc narrowing with circumferential bulging. Posterior ?element hypertrophy. Moderate spinal stenosis with cauda equina ?crowding, borderline advanced. Right more than left foraminal ?narrowing, moderate on the right. ?  ?L3-L4: Disc narrowing and bulging with chronic small up turning disc ?protrusion. Degenerative posterior element hypertrophy with moderate ?right more than left foraminal stenosis. Moderate, borderline ?advanced, spinal stenosis that is progressed. ?  ?L4-L5: PLIF with solid arthrodesis.  No impingement ?  ?L5-S1:PLIF with solid arthrodesis and no impingement ?  ?IMPRESSION: ?1. L1-2 to L3-4 disc and facet degeneration with progression from ?2017. L2-3 and L3-4 moderate borderline advanced spinal stenosis. ?2. L4-5 and L5-S1 PLIF with solid arthrodesis and no residual ?impingement. ?3. Up to moderate foraminal narrowing as described above. ?  ?  ?Electronically Signed ?  By: Monte Fantasia M.D. ?  On: 11/11/2018 10:16  ? ?She reports that she has never smoked. She has never used smokeless tobacco. No results for input(s): HGBA1C, LABURIC in the last 8760 hours. ? ?Objective:  VS:  HT:    WT:   BMI:     BP:135/84  HR:80bpm  TEMP: ( )  RESP:  ?Physical Exam ?Vitals and nursing note reviewed.  ?HENT:   ?   Head: Normocephalic and atraumatic.  ?   Right Ear: External ear normal.  ?   Left Ear: External ear normal.  ?   Nose: Nose normal.  ?   Mouth/Throat:  ?   Mouth: Mucous membranes are moist.  ?Eyes:  ?   Extraocular Movements: Extraocular movements intact.  ?Cardiovascular:  ?   Rate and Rhythm: Normal rate.  ?   Pulses: Normal pulses.  ?Pulmonary:  ?   Effort: Pulmonary effort is normal.  ?Abdominal:  ?   General: Abdomen is flat. There is no distension.  ?Musculoskeletal:     ?   General: Tenderness present.  ?   Cervical back: Normal range of motion.  ?   Comments: Pt rises from seated position to standing without difficulty. Good lumbar range of motion. Strong distal strength without clonus, no pain upon palpation of greater trochanters. Sensation intact bilaterally. Ambulates with walker, gait slow and unsteady  ?Skin: ?   General: Skin is warm and dry.  ?   Capillary Refill: Capillary refill takes less than 2 seconds.  ?Neurological:  ?   Mental Status: She is alert and oriented to person, place, and time.  ?   Gait: Gait abnormal.  ?Psychiatric:     ?   Mood and Affect: Mood normal.     ?  Behavior: Behavior normal.  ?  ?Ortho Exam ? ?Imaging: ?No results found. ? ?Past Medical/Family/Surgical/Social History: ?Medications & Allergies reviewed per EMR, new medications updated. ?Patient Active Problem List  ? Diagnosis Date Noted  ? Chronic right-sided thoracic back pain 12/21/2016  ? Right-sided low back pain without sciatica 12/21/2016  ? Post laminectomy syndrome 12/21/2016  ? Chronic pain syndrome 12/21/2016  ? Calculus, ureter 12/21/2016  ? Obesity 12/21/2016  ? Abdominal aortic aneurysm 07/04/2016  ? Chronic pancreatitis ([place]) 07/04/2016  ? Dizziness 07/04/2016  ? 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 Essential hypertension 07/04/2016  ? Gastroesophageal reflux disease 07/04/2016  ? Osteoarthritis 07/04/2016  ? History of abdominal aortic aneurysm (AAA) 06/30/2016  ? Dry eyes 02/10/2016  ? Nuclear  sclerosis 02/10/2016  ? Type 2 diabetes mellitus without complication ([place]) 83/38/2505  ? Abdominal wall hernia 11/19/2015  ? History of congestive heart failure 08/18/2015  ? 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

## 2021-06-03 NOTE — Progress Notes (Signed)
Pt state lower back pain that travels her left leg to her knee. Pt state walking, standing and bending makes the pain worse. Pt state she takes pain meds and uses heat to help ease her pain. ? ? ?Numeric Pain Rating Scale and Functional Assessment ?Average Pain 10 ?Pain Right Now 8 ?My pain is constant, dull, stabbing, tingling, and aching ?Pain is worse with: walking, bending, standing, and some activites ?Pain improves with: heat/ice and medication ? ? ?In the last MONTH (on 0-10 scale) has pain interfered with the following? ? ?1. General activity like being  able to carry out your everyday physical activities such as walking, climbing stairs, carrying groceries, or moving a chair?  ?Rating(5) ? ?2. Relation with others like being able to carry out your usual social activities and roles such as  activities at home, at work and in your community. ?Rating(6) ? ?3. Enjoyment of life such that you have  been bothered by emotional problems such as feeling anxious, depressed or irritable?  ?Rating(7) ? ?

## 2021-06-07 ENCOUNTER — Telehealth: Payer: Self-pay | Admitting: Physical Medicine and Rehabilitation

## 2021-06-07 NOTE — Telephone Encounter (Signed)
Pt's daughter called with an FYI. Pt was upset that she was unable to see Dr. Ernestina Patches and did not want to see the PA and asking if pt's bill will be billed to see PA and not the dr. I expaline the PA and dr is billed under the same office visit. I offered for Dr. Ernestina Patches to call pt and also add that pt only want to see Dr. Ernestina Patches for now on. Pt's daughter stated she appreciate the offer but will not be coming back. ?

## 2021-07-21 ENCOUNTER — Ambulatory Visit: Payer: Medicare Other | Admitting: Physician Assistant

## 2021-09-22 ENCOUNTER — Ambulatory Visit: Payer: Medicare Other | Admitting: Physician Assistant

## 2021-10-28 ENCOUNTER — Ambulatory Visit: Payer: Medicare Other | Admitting: Physician Assistant
